# Patient Record
Sex: Female | Born: 1980 | Race: White | Hispanic: No | Marital: Married | Smoking: Current every day smoker
Health system: Southern US, Community
[De-identification: ages and names within clinical notes are randomized; demographics above are authoritative.]

## PROBLEM LIST (undated history)

## (undated) DIAGNOSIS — F3130 Bipolar disorder, current episode depressed, mild or moderate severity, unspecified: Secondary | ICD-10-CM

## (undated) DIAGNOSIS — F419 Anxiety disorder, unspecified: Secondary | ICD-10-CM

## (undated) DIAGNOSIS — R6510 Systemic inflammatory response syndrome (SIRS) of non-infectious origin without acute organ dysfunction: Secondary | ICD-10-CM

## (undated) DIAGNOSIS — L732 Hidradenitis suppurativa: Secondary | ICD-10-CM

## (undated) DIAGNOSIS — E669 Obesity, unspecified: Secondary | ICD-10-CM

## (undated) DIAGNOSIS — F329 Major depressive disorder, single episode, unspecified: Secondary | ICD-10-CM

## (undated) DIAGNOSIS — F319 Bipolar disorder, unspecified: Secondary | ICD-10-CM

## (undated) DIAGNOSIS — I10 Essential (primary) hypertension: Secondary | ICD-10-CM

## (undated) DIAGNOSIS — M545 Low back pain: Secondary | ICD-10-CM

## (undated) DIAGNOSIS — E871 Hypo-osmolality and hyponatremia: Secondary | ICD-10-CM

## (undated) HISTORY — DX: Hidradenitis suppurativa: L73.2

## (undated) HISTORY — DX: Essential (primary) hypertension: I10

## (undated) HISTORY — DX: Hypo-osmolality and hyponatremia: E87.1

## (undated) HISTORY — DX: Systemic inflammatory response syndrome (sirs) of non-infectious origin without acute organ dysfunction: R65.10

## (undated) HISTORY — DX: Obesity, unspecified: E66.9

## (undated) HISTORY — DX: Low back pain: M54.5

## (undated) HISTORY — PX: CHOLECYSTECTOMY: SHX55

## (undated) HISTORY — DX: Major depressive disorder, single episode, unspecified: F32.9

## (undated) HISTORY — DX: Bipolar disorder, current episode depressed, mild or moderate severity, unspecified: F31.30

## (undated) HISTORY — PX: WISDOM TOOTH EXTRACTION: SHX21

## (undated) SURGERY — Surgical Case
Anesthesia: *Unknown

---
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
## 2003-11-05 HISTORY — PX: CYSTECTOMY: SHX5119

## 2009-10-27 ENCOUNTER — Emergency Department (HOSPITAL_COMMUNITY): Admission: EM | Admit: 2009-10-27 | Discharge: 2009-10-27 | Payer: Self-pay | Admitting: Emergency Medicine

## 2009-12-16 ENCOUNTER — Emergency Department (HOSPITAL_COMMUNITY): Admission: EM | Admit: 2009-12-16 | Discharge: 2009-12-16 | Payer: Self-pay | Admitting: Emergency Medicine

## 2010-06-19 ENCOUNTER — Emergency Department (HOSPITAL_COMMUNITY)
Admission: EM | Admit: 2010-06-19 | Discharge: 2010-06-19 | Payer: Self-pay | Source: Home / Self Care | Admitting: Emergency Medicine

## 2010-12-09 ENCOUNTER — Emergency Department (HOSPITAL_COMMUNITY)
Admission: EM | Admit: 2010-12-09 | Discharge: 2010-12-09 | Disposition: A | Discharge status: Home / Self Care | Payer: Federal, State, Local not specified - PPO | Attending: Emergency Medicine | Admitting: Emergency Medicine

## 2010-12-09 DIAGNOSIS — Z79899 Other long term (current) drug therapy: Secondary | ICD-10-CM | POA: Insufficient documentation

## 2010-12-09 DIAGNOSIS — K219 Gastro-esophageal reflux disease without esophagitis: Secondary | ICD-10-CM | POA: Insufficient documentation

## 2010-12-09 DIAGNOSIS — L02219 Cutaneous abscess of trunk, unspecified: Secondary | ICD-10-CM | POA: Insufficient documentation

## 2010-12-09 DIAGNOSIS — R109 Unspecified abdominal pain: Secondary | ICD-10-CM | POA: Insufficient documentation

## 2010-12-09 DIAGNOSIS — L03319 Cellulitis of trunk, unspecified: Secondary | ICD-10-CM | POA: Insufficient documentation

## 2010-12-09 DIAGNOSIS — E669 Obesity, unspecified: Secondary | ICD-10-CM | POA: Insufficient documentation

## 2011-01-09 ENCOUNTER — Ambulatory Visit: Payer: Federal, State, Local not specified - PPO | Admitting: Family Medicine

## 2011-01-18 LAB — CULTURE, ROUTINE-ABSCESS: Gram Stain: NONE SEEN

## 2011-07-07 ENCOUNTER — Emergency Department (HOSPITAL_COMMUNITY)
Admission: EM | Admit: 2011-07-07 | Discharge: 2011-07-07 | Disposition: A | Discharge status: Home / Self Care | Payer: Federal, State, Local not specified - PPO | Attending: Emergency Medicine | Admitting: Emergency Medicine

## 2011-07-07 DIAGNOSIS — M545 Low back pain, unspecified: Secondary | ICD-10-CM | POA: Insufficient documentation

## 2011-07-07 DIAGNOSIS — K219 Gastro-esophageal reflux disease without esophagitis: Secondary | ICD-10-CM | POA: Insufficient documentation

## 2011-07-07 DIAGNOSIS — M79609 Pain in unspecified limb: Secondary | ICD-10-CM | POA: Insufficient documentation

## 2011-07-07 DIAGNOSIS — IMO0001 Reserved for inherently not codable concepts without codable children: Secondary | ICD-10-CM | POA: Insufficient documentation

## 2011-07-17 ENCOUNTER — Ambulatory Visit (INDEPENDENT_AMBULATORY_CARE_PROVIDER_SITE_OTHER): Payer: Federal, State, Local not specified - PPO | Admitting: Family Medicine

## 2011-07-17 ENCOUNTER — Encounter: Payer: Self-pay | Admitting: Family Medicine

## 2011-07-17 VITALS — BP 142/90 | HR 88 | Temp 98.3°F | Resp 12 | Ht 64.75 in | Wt 251.0 lb

## 2011-07-17 DIAGNOSIS — F419 Anxiety disorder, unspecified: Secondary | ICD-10-CM

## 2011-07-17 DIAGNOSIS — I10 Essential (primary) hypertension: Secondary | ICD-10-CM | POA: Insufficient documentation

## 2011-07-17 DIAGNOSIS — K219 Gastro-esophageal reflux disease without esophagitis: Secondary | ICD-10-CM | POA: Insufficient documentation

## 2011-07-17 DIAGNOSIS — L732 Hidradenitis suppurativa: Secondary | ICD-10-CM | POA: Insufficient documentation

## 2011-07-17 DIAGNOSIS — F411 Generalized anxiety disorder: Secondary | ICD-10-CM

## 2011-07-17 DIAGNOSIS — M545 Low back pain: Secondary | ICD-10-CM

## 2011-07-17 MED ORDER — SERTRALINE HCL 50 MG PO TABS: 50.0000 mg | ORAL_TABLET | Freq: Every day | ORAL | Status: DC

## 2011-07-17 NOTE — Progress Notes (Signed)
  Subjective:    Patient ID: Jill Peters, female    DOB: 1980-11-10, 30 y.o.   MRN: [id number]  HPI New to establish care. Past medical history reviewed. History of GERD, hypertension, and hidradenitis suppurativa. Recently started HCTZ 25 mg a day for hypertension. GERD controlled with omeprazole 20 mg daily. Seasonal allergies treated with Zyrtec 10 mg daily. Allergy to penicillin. Previous intolerance in that proximal and.  Recent problem with low back pain. Similar episode about one year ago. No history of depression. Low frustration tolerance at times. This is becoming more of a daily problem. She does very anxious in general. No specific stressors.  Social history is that she is married. Works as a [employer]er. Smokes 1/2-1 packs years per day. Occasional alcohol use.   Review of Systems  Constitutional: Negative for fever, chills, appetite change and unexpected weight change.  Respiratory: Negative for cough and shortness of breath.   Cardiovascular: Negative for chest pain, palpitations and leg swelling.  Gastrointestinal: Negative for abdominal pain.  Musculoskeletal: Positive for back pain. Negative for myalgias and joint swelling.  Neurological: Negative for dizziness, weakness, numbness and headaches.  Hematological: Negative for adenopathy.  Psychiatric/Behavioral: The patient is nervous/anxious.        Objective:   Physical Exam  Constitutional: She is oriented to person, place, and time. She appears well-developed and well-nourished.  Neck: Neck supple. No thyromegaly present.  Cardiovascular: Normal rate, regular rhythm and normal heart sounds.   No murmur heard. Pulmonary/Chest: Effort normal and breath sounds normal. No respiratory distress. She has no wheezes. She has no rales.  Musculoskeletal: She exhibits no edema.       Straight leg raise is negative bilaterally  Lymphadenopathy:    She has no cervical adenopathy.  Neurological: She is alert and  oriented to person, place, and time. No cranial nerve deficit.  Psychiatric: She has a normal mood and affect. Her behavior is normal.          Assessment & Plan:  #1 low back pain. Nonfocal neurologic exam today. Discussed possible physical therapy. Patient will try home exercises first.  #2 chronic anxiety. No concern for depression. No suspicion for bipolar. Discussed possible counseling. Trial sertraline 50 mg daily and reassess one month  #3 hypertension. Marginal control. Her weight loss. Consider additional medication not better control next visit  #4 history of GERD

## 2011-07-17 NOTE — Patient Instructions (Signed)
Low Back Strain with Rehab   A strain is an injury in which a tendon or muscle is torn. The muscles and tendons of the lower back are vulnerable to strains. However, these muscles and tendons are very strong and require a great force to be injured. Strains are classified into three categories. Grade 1 strains cause pain, but the tendon is not lengthened.  Grade 2 strains include a lengthened ligament, due to the ligament being stretched or partially ruptured. With grade 2 strains there is still function, although the function may be decreased. Grade 3 strains involve a complete tear of the tendon or muscle, and function is usually impaired.   SYMPTOMS  Pain in the lower back.  Pain that affects one side more than the other.  Pain that gets worse with movement and may be felt in the hip, buttocks, or back of the thigh.  Muscle spasms of the muscles in the back.  Swelling along the muscles of the back.  Loss of strength of the back muscles.  Crackling sound (crepitation) when the muscles are touched.   CAUSES  Lower back strains occur when a force is placed on the muscles or tendons that is greater than they can handle. Common causes of injury include:  Prolonged overuse of the muscle-tendon units in the lower back, usually from incorrect posture.  A single violent injury or force applied to the back.   RISK INCREASES WITH   Sports that involve twisting forces on the spine or a lot of bending at the waist (football, rugby, weightlifting, bowling, golf, tennis, speed skating, racquetball, swimming, running, gymnastics, diving).  Poor strength and flexibility.  Failure to warm up properly before activity.  Family history of lower back pain or disk disorders.  Previous back injury or surgery (especially fusion).  Poor posture with lifting, especially heavy objects.  Prolonged sitting, especially with poor posture.   PREVENTIVE MEASURES   Learn and use proper posture when  sitting or lifting (maintain proper posture when sitting, lift using the knees and legs, not at the waist).  Warm up and stretch properly before activity.  Allow for adequate recovery between workouts.  Maintain physical fitness: l Strength, flexibility, and endurance. l Cardiovascular fitness.   PROGNOSIS If treated properly, lower back strains usually heal within 6 weeks.   POSSIBLE COMPLICATIONS   Recurring symptoms, resulting in a chronic problem.  Chronic inflammation, scarring, and partial muscle-tendon tear.  Delayed healing or resolution of symptoms.  Prolonged disability.   GENERAL TREATMENT CONSIDERATIONS  Treatment first involves the use of ice and medicine, to reduce pain and inflammation. The use of strengthening and stretching exercises may help reduce pain with activity. These exercises may be performed at home or with a therapist. Severe injuries may require referral to a therapist for further evaluation and treatment, such as ultrasound. Your caregiver may advise that you wear a back brace or corset, to help reduce pain and discomfort. Often, prolonged bed rest results in greater harm then benefit. Corticosteroid injections may be recommended. However, these should be reserved for the most serious cases. It is important to avoid using your back when lifting objects. At night, sleep on your back on a firm mattress with a pillow placed under your knees. If non-surgical treatment is unsuccessful, surgery may be needed.    MEDICATION:   If pain medicine is needed, nonsteroidal anti-inflammatory medicines (aspirin and ibuprofen), or other minor pain relievers (acetaminophen), are often advised.   Do not take pain medicine  for 7 days before surgery.   Prescription pain relievers may be given, if your caregiver thinks they are needed. Use only as directed and only as much as you need.  Ointments applied to the skin may be helpful.  Corticosteroid injections may be given  by your caregiver. These injections should be reserved for the most serious cases, because they may only be given a certain number of times.   HEAT AND COLD:   Cold treatment (icing) should be applied for 10 to 15 minutes every 2 to 3 hours for inflammation and pain, and immediately after activity that aggravates your symptoms. Use ice packs or an ice massage.  Heat treatment may be used before performing stretching and strengthening activities prescribed by your caregiver, physical therapist, or athletic trainer. Use a heat pack or a warm water soak.     SEEK MEDICAL CARE IF:   Symptoms get worse or do not improve in 2 to 4 weeks, despite treatment.  You develop numbness, weakness, or loss of bowel or bladder function.  New, unexplained symptoms develop. (Drugs used in treatment may produce side effects.)     EXERCISES   RANGE OF MOTION AND STRETCHING EXERCISES - Low Back Strain Most people with lower back pain will find that their symptoms get worse with excessive bending forward (flexion) or arching at the lower back (extension). The exercises which will help resolve your symptoms will focus on the opposite motion.    Your physician, physical therapist or athletic trainer will help you determine which exercises will be most helpful to resolve your lower back pain. Do not complete any exercises without first consulting with your caregiver. Discontinue any exercises which make your symptoms worse until you speak to your caregiver.    If you have pain, numbness or tingling which travels down into your buttocks, leg or foot, the goal of the therapy is for these symptoms to move closer to your back and eventually resolve. Sometimes, these leg symptoms will get better, but your lower back pain may worsen. This is typically an indication of progress in your rehabilitation. Be very alert to any changes in your symptoms and the activities in which you participated in the 24 hours prior to the  change. Sharing this information with your caregiver will allow him/her to most efficiently treat your condition.    These exercises may help you when beginning to rehabilitate your injury. Your symptoms may resolve with or without further involvement from your physician, physical therapist or athletic trainer. While completing these exercises, remember:   Restoring tissue flexibility helps normal motion to return to the joints. This allows healthier, less painful movement and activity.  An effective stretch should be held for at least 30 seconds.  A stretch should never be painful. You should only feel a gentle lengthening or release in the stretched tissue.      FLEXION RANGE OF MOTION AND STRETCHING EXERCISES:   STRETCH - Flexion, Single Knee to Chest   Lie on a firm bed or floor with both legs extended in front of you.  Keeping one leg in contact with the floor, bring your opposite knee to your chest. Hold your leg in place by either grabbing behind your thigh or at your knee.  Pull until you feel a gentle stretch in your lower back. Hold __________ seconds.  Slowly release your grasp and repeat the exercise with the opposite side. Repeat __________ times. Complete this exercise __________ times per day.  STRETCH - Flexion, Double Knee to Chest   Lie on a firm bed or floor with both legs extended in front of you.  Keeping one leg in contact with the floor, bring your opposite knee to your chest.    Tense your stomach muscles to support your back and then lift your other knee to your chest. Hold your legs in place by either grabbing behind your thighs or at your knees.  Pull both knees toward your chest until you feel a gentle stretch in your lower back. Hold __________ seconds.  Tense your stomach muscles and slowly return one leg at a time to the floor. Repeat __________ times. Complete this exercise __________ times per day.     STRETCH - Low Trunk Rotation   Lie on  a firm bed or floor. Keeping your legs in front of you, bend your knees so they are both pointed toward the ceiling and your feet are flat on the floor.  Extend your arms out to the side. This will stabilize your upper body by keeping your shoulders in contact with the floor.  Gently and slowly drop both knees together to one side until you feel a gentle stretch in your lower back. Hold for __________ seconds.   Tense your stomach muscles to support your lower back as you bring your knees back to the starting position. Repeat the exercise to the other side. Repeat __________ times. Complete this exercise __________ times per day      EXTENSION RANGE OF MOTION AND FLEXIBILITY EXERCISES:  STRETCH - Extension, Prone on Elbows   Lie on your stomach on the floor, a bed will be too soft. Place your palms about shoulder width apart and at the height of your head.  Place your elbows under your shoulders. If this is too painful, stack pillows under your chest.  Allow your body to relax so that your hips drop lower and make contact more completely with the floor.  Hold this position for __________ seconds.  Slowly return to lying flat on the floor. Repeat __________ times. Complete this exercise __________ times per day.     RANGE OF MOTION - Extension, Prone Press Ups   Lie on your stomach on the floor, a bed will be too soft. Place your palms about shoulder width apart and at the height of your head.  Keeping your back as relaxed as possible, slowly straighten your elbows while keeping your hips on the floor. You may adjust the placement of your hands to maximize your comfort. As you gain motion, your hands will come more underneath your shoulders.  Hold this position __________ seconds.  Slowly return to lying flat on the floor. Repeat __________ times. Complete this exercise __________ times per day.      RANGE OF MOTION- Quadruped, Neutral Spine   Assume a hands and knees position  on a firm surface. Keep your hands under your shoulders and your knees under your hips. You may place padding under your knees for comfort.    Drop your head and point your tail bone toward the ground below you.  This will round out your lower back like an angry cat. Hold this position for __________ seconds.   Slowly lift your head and release your tail bone so that your back sags into a large arch, like an old horse.  Hold this position for __________ seconds.   Repeat this until you feel limber in your lower back.  Now, find your "sweet spot."  This will be the most comfortable position somewhere between the two previous positions. This is your neutral spine. Once you have found this position, tense your stomach muscles to support your lower back.  Hold this position for __________ seconds. Repeat __________ times. Complete this exercise __________ times per day.      STRENGTHENING EXERCISES - Low Back Strain These exercises may help you when beginning to rehabilitate your injury. These exercises should be done near your "sweet spot." This is the neutral, low-back arch, somewhere between fully rounded and fully arched, that is your least painful position. When performed in this safe range of motion, these exercises can be used for people who have either a flexion or extension based injury. These exercises may resolve your symptoms with or without further involvement from your physician, physical therapist or athletic trainer. While completing these exercises, remember:   Muscles can gain both the endurance and the strength needed for everyday activities through controlled exercises.  Complete these exercises as instructed by your physician, physical therapist or athletic trainer. Increase the resistance and repetitions only as guided.  You may experience muscle soreness or fatigue, but the pain or discomfort you are trying to eliminate should never worsen during these exercises. If this pain  does worsen, stop and make certain you are following the directions exactly. If the pain is still present after adjustments, discontinue the exercise until you can discuss the trouble with your caregiver.     STRENGTHENING - Deep Abdominals, Pelvic Tilt   Lie on a firm bed or floor. Keeping your legs in front of you, bend your knees so they are both pointed toward the ceiling and your feet are flat on the floor.  Tense your lower abdominal muscles to press your lower back into the floor. This motion will rotate your pelvis so that your tail bone is scooping upwards rather than pointing at your feet or into the floor.  With a gentle tension and even breathing, hold this position for __________ seconds. Repeat __________ times. Complete this exercise __________ times per day.     STRENGTHENING - Abdominals, Crunches   Lie on a firm bed or floor. Keeping your legs in front of you, bend your knees so they are both pointed toward the ceiling and your feet are flat on the floor. Cross your arms over your chest.    Slightly tip your chin down without bending your neck.  Tense your abdominals and slowly lift your trunk high enough to just clear your shoulder blades. Lifting higher can put excessive stress on the lower back and does not further strengthen your abdominal muscles.  Control your return to the starting position. Repeat __________ times. Complete this exercise __________ times per day.   STRENGTHENING - Quadruped, Opposite UE/LE Lift   Assume a hands and knees position on a firm surface. Keep your hands under your shoulders and your knees under your hips. You may place padding under your knees for comfort.    Find your neutral spine and gently tense your abdominal muscles so that you can maintain this position. Your shoulders and hips should form a rectangle that is parallel with the floor and is not twisted.   Keeping your trunk steady, lift your right hand no higher than your  shoulder and then your left leg no higher than your hip. Make sure you are not holding your breath. Hold this position __________ seconds.  Continuing to keep your abdominal muscles tense and your back steady, slowly  return to your starting position. Repeat with the opposite arm and leg. Repeat __________ times. Complete this exercise __________ times per day.     STRENGTHENING - Lower Abdominals, Double Knee Lift  Lie on a firm bed or floor. Keeping your legs in front of you, bend your knees so they are both pointed toward the ceiling and your feet are flat on the floor.   Tense your abdominal muscles to brace your lower back and slowly lift both of your knees until they come over your hips. Be certain not to hold your breath.    Hold __________ seconds. Using your abdominal muscles, return to the starting position in a slow and controlled manner.   Repeat __________ times. Complete this exercise __________ times per day.      POSTURE AND BODY MECHANICS CONSIDERATIONS - Low Back Strain Keeping correct posture when sitting, standing or completing your activities will reduce the stress put on different body tissues, allowing injured tissues a chance to heal and limiting painful experiences. The following are general guidelines for improved posture. Your physician or physical therapist will provide you with any instructions specific to your needs. While reading these guidelines, remember:  The exercises prescribed by your provider will help you have the flexibility and strength to maintain correct postures.  The correct posture provides the best environment for your joints to work. All of your joints have less wear and tear when properly supported by a spine with good posture. This means you will experience a healthier, less painful body.  Correct posture must be practiced with all of your activities, especially prolonged sitting and standing.  Correct posture is as important when doing  repetitive low-stress activities (typing) as it is when doing a single heavy-load activity (lifting).       RESTING POSITIONS Consider which positions are most painful for you when choosing a resting position.  If you have pain with flexion-based activities (sitting, bending, stooping, squatting), choose a position that allows you to rest in a less flexed posture. You would want to avoid curling into a fetal position on your side. If your pain worsens with extension-based activities (prolonged standing, working overhead), avoid resting in an extended position such as sleeping on your stomach. Most people will find more comfort when they rest with their spine in a more neutral position, neither too rounded nor too arched.  Lying on a non-sagging bed on your side with a pillow between your knees, or on your back with a pillow under your knees will often provide some relief. Keep in mind, being in any one position for a prolonged period of time, no matter how correct your posture, can still lead to stiffness.     PROPER SITTING POSTURE In order to minimize stress and discomfort on your spine, you must sit with correct posture. Sitting with good posture should be effortless for a healthy body. Returning to good posture is a gradual process. Many people can work toward this most comfortably by using various supports until they have the flexibility and strength to maintain this posture on their own.   When sitting with proper posture, your ears will fall over your shoulders and your shoulders will fall over your hips. You should use the back of the chair to support your upper back. Your lower back will be in a neutral position, just slightly arched. You may place a small pillow or folded towel at the base of your lower back for  support.    When  working at [employer], create an environment that supports good, upright posture. Without extra support, muscles tire, which leads to excessive strain on joints and other  tissues.  Keep these recommendations in mind:   CHAIR:    A chair should be able to slide under your desk when your back makes contact with the back of the chair. This allows you to work closely.  The chair's height should allow your eyes to be level with the upper part of your monitor and your hands to be slightly lower than your elbows.  BODY POSITION  Your feet should make contact with the floor. If this is not possible, use a foot rest.  Keep your ears over your shoulders. This will reduce stress on your neck and lower back.     INCORRECT SITTING POSTURES  If you are feeling tired and unable to assume a healthy sitting posture, do not slouch or slump. This puts excessive strain on your back tissues, causing more damage and pain. Healthier options include:  Using more support, like a lumbar pillow.  Switching tasks to something that requires you to be upright or walking.  Talking a brief walk.  Lying down to rest in a neutral-spine position.      PROLONGED STANDING WHILE SLIGHTLY LEANING FORWARD  When completing a task that requires you to lean forward while standing in one place for a long time, place either foot up on a stationary 2-4 inch high object to help maintain the best posture. When both feet are on the ground, the lower back tends to lose its slight inward curve. If this curve flattens (or becomes too large), then the back and your other joints will experience too much stress, tire more quickly, and can cause pain.     CORRECT STANDING POSTURES Proper standing posture should be assumed with all daily activities, even if they only take a few moments, like when brushing your teeth. As in sitting, your ears should fall over your shoulders and your shoulders should fall over your hips. You should keep a slight tension in your abdominal muscles to brace your spine. Your tailbone should point down to the ground, not behind your body, resulting in an over-extended swayback  posture.      INCORRECT STANDING POSTURES  Common incorrect standing postures include a forward head, locked knees and/or an excessive swayback.     WALKING Walk with an upright posture. Your ears, shoulders and hips should all line-up.     PROLONGED ACTIVITY IN A FLEXED POSITION When completing a task that requires you to bend forward at your waist or lean over a low surface, try to find a way to stabilize 3 out of 4 of your limbs. You can place a hand or elbow on your thigh or rest a knee on the surface you are reaching across. This will provide you more stability so that your muscles do not fatigue as quickly. By keeping your knees relaxed, or slightly bent, you will also reduce stress across your lower back.     CORRECT LIFTING TECHNIQUES DO :     Assume a wide stance. This will provide you more stability and the opportunity to get as close as possible to the object which you are lifting.  Tense your abdominals to brace your spine. Bend at the knees and hips. Keeping your back locked in a neutral-spine position, lift using your leg muscles. Lift with your legs, keeping your back straight.  Test the weight of unknown  objects before attempting to lift them.  Try to keep your elbows locked down at your sides in order get the best strength from your shoulders when carrying an object.  Always ask for help when lifting heavy or awkward objects.    INCORRECT LIFTING TECHNIQUES DO NOT:   Lock your knees when lifting, even if it is a small object.  Bend and twist.  Pivot at your feet or move your feet when needing to change directions.  Assume that you can safely pick up even a paperclip without proper posture.   Document Released: 10/21/2005  Document Re-Released: 08/18/2009 [facility] Patient Information 2011 [place], [place].Back Exercises   These exercises may help you when beginning to rehabilitate your injury. Your symptoms may resolve with or without further involvement from  your physician, physical therapist or athletic trainer. While completing these exercises, remember:   Restoring tissue flexibility helps normal motion to return to the joints. This allows healthier, less painful movement and activity.  An effective stretch should be held for at least 30 seconds.  A stretch should never be painful. You should only feel a gentle lengthening or release in the stretched tissue.   STRETCH - Extension, Prone on Elbows   Lie on your stomach on the floor, a bed will be too soft. Place your palms about shoulder width apart and at the height of your head.  Place your elbows under your shoulders. If this is too painful, stack pillows under your chest.  Allow your body to relax so that your hips drop lower and make contact more completely with the floor.  Hold this position for __________ seconds.  Slowly return to lying flat on the floor. Repeat __________ times. Complete this exercise __________ times per day.      RANGE OF MOTION - Extension, Prone Press Ups   Lie on your stomach on the floor, a bed will be too soft. Place your palms about shoulder width apart and at the height of your head.  Keeping your back as relaxed as possible, slowly straighten your elbows while keeping your hips on the floor. You may adjust the placement of your hands to maximize your comfort. As you gain motion, your hands will come more underneath your shoulders.  Hold this position __________ seconds.  Slowly return to lying flat on the floor. Repeat __________ times. Complete this exercise __________ times per day.        RANGE OF MOTION- Quadruped, Neutral Spine   Assume a hands and knees position on a firm surface. Keep your hands under your shoulders and your knees under your hips. You may place padding under your knees for comfort.    Drop your head and point your tail bone toward the ground below you.  This will round out your low back like an angry cat. Hold this  position for __________ seconds.   Slowly lift your head and release your tail bone so that your back sags into a large arch, like an old horse.  Hold this position for __________ seconds.   Repeat this until you feel limber in your low back.  Now, find your "sweet spot." This will be the most comfortable position somewhere between the two previous positions. This is your neutral spine. Once you have found this position, tense your stomach muscles to support your low back.  Hold this position for __________ seconds. Repeat __________ times.  Complete this exercise __________ times per day.      STRETCH - Flexion, Single  Knee to Chest   Lie on a firm bed or floor with both legs extended in front of you.  Keeping one leg in contact with the floor, bring your opposite knee to your chest. Hold your leg in place by either grabbing behind your thigh or at your knee.  Pull until you feel a gentle stretch in your low back. Hold __________ seconds.  Slowly release your grasp and repeat the exercise with the opposite side. Repeat __________ times. Complete this exercise __________ times per day.     STRETCH - Hamstrings, Standing  Stand or sit and extend your __________ leg, placing your foot on a chair or foot stool  Keeping a slight arch in your low back and your hips straight forward.    Lead with your chest and lean forward at the waist until you feel a gentle stretch in the back of your __________ knee or thigh. (When done correctly, this exercise requires leaning only a small distance.)  Hold this position for __________ seconds. Repeat __________ times. Complete this stretch __________ times per day.     STRENGTHENING - Deep Abdominals, Pelvic Tilt   Lie on a firm bed or floor. Keeping your legs in front of you, bend your knees so they are both pointed toward the ceiling and your feet are flat on the floor.  Tense your lower abdominal muscles to press your low back into the  floor. This motion will rotate your pelvis so that your tail bone is scooping upwards rather than pointing at your feet or into the floor.  With a gentle tension and even breathing, hold this position for __________ seconds. Repeat __________ times. Complete this exercise __________ times per day.     STRENGTHENING - Abdominals, Crunches   Lie on a firm bed or floor. Keeping your legs in front of you, bend your knees so they are both pointed toward the ceiling and your feet are flat on the floor. Cross your arms over your chest.    Slightly tip your chin down without bending your neck.  Tense your abdominals and slowly lift your trunk high enough to just clear your shoulder blades. Lifting higher can put excessive stress on the low back and does not further strengthen your abdominal muscles.  Control your return to the starting position. Repeat __________ times. Complete this exercise __________ times per day.      STRENGTHENING - Quadruped, Opposite UE/LE Lift   Assume a hands and knees position on a firm surface. Keep your hands under your shoulders and your knees under your hips.  You may place padding under your knees for comfort.    Find your neutral spine and gently tense your abdominal muscles so that you can maintain this position. Your shoulders and hips should form a rectangle that is parallel with the floor and is not twisted.   Keeping your trunk steady, lift your right hand no higher than your shoulder and then your left leg no higher than your hip. Make sure you are not holding your breath. Hold this position __________ seconds.  Continuing to keep your abdominal muscles tense and your back steady, slowly return to your starting position. Repeat with the opposite arm and leg. Repeat __________ times. Complete this exercise __________ times per day.   Document Released: 11/08/2005  Document Re-Released: 10/09/2009 [facility] Patient Information 2011 [place], [place].

## 2011-08-01 ENCOUNTER — Ambulatory Visit: Payer: Federal, State, Local not specified - PPO | Admitting: Internal Medicine

## 2011-08-12 ENCOUNTER — Ambulatory Visit (HOSPITAL_BASED_OUTPATIENT_CLINIC_OR_DEPARTMENT_OTHER)
Admission: RE | Admit: 2011-08-12 | Discharge: 2011-08-12 | Disposition: A | Discharge status: Home / Self Care | Payer: Federal, State, Local not specified - PPO | Source: Ambulatory Visit | Attending: Internal Medicine | Admitting: Internal Medicine

## 2011-08-12 ENCOUNTER — Encounter: Payer: Self-pay | Admitting: Internal Medicine

## 2011-08-12 ENCOUNTER — Ambulatory Visit (INDEPENDENT_AMBULATORY_CARE_PROVIDER_SITE_OTHER): Payer: Federal, State, Local not specified - PPO | Admitting: Internal Medicine

## 2011-08-12 VITALS — BP 136/80 | HR 93 | Temp 98.0°F | Resp 20 | Ht 64.0 in | Wt 241.0 lb

## 2011-08-12 DIAGNOSIS — L0390 Cellulitis, unspecified: Secondary | ICD-10-CM

## 2011-08-12 DIAGNOSIS — L0291 Cutaneous abscess, unspecified: Secondary | ICD-10-CM

## 2011-08-12 DIAGNOSIS — M545 Low back pain, unspecified: Secondary | ICD-10-CM

## 2011-08-12 DIAGNOSIS — L732 Hidradenitis suppurativa: Secondary | ICD-10-CM

## 2011-08-12 DIAGNOSIS — M549 Dorsalgia, unspecified: Secondary | ICD-10-CM

## 2011-08-12 DIAGNOSIS — I10 Essential (primary) hypertension: Secondary | ICD-10-CM

## 2011-08-12 HISTORY — DX: Low back pain, unspecified: M54.50

## 2011-08-12 MED ORDER — KETOROLAC TROMETHAMINE 30 MG/ML IJ SOLN
30.0000 mg | Freq: Once | INTRAMUSCULAR | Status: AC
  Administered 2011-08-12: 30 mg via INTRAMUSCULAR

## 2011-08-12 MED ORDER — CYCLOBENZAPRINE HCL 10 MG PO TABS: 10.0000 mg | ORAL_TABLET | Freq: Three times a day (TID) | ORAL | Status: DC | PRN

## 2011-08-12 MED ORDER — DOXYCYCLINE HYCLATE 100 MG PO TABS: 100.0000 mg | ORAL_TABLET | Freq: Two times a day (BID) | ORAL | Status: AC

## 2011-08-12 MED ORDER — METHYLPREDNISOLONE (PAK) 4 MG PO TABS: 4.0000 mg | ORAL_TABLET | Freq: Every day | ORAL | Status: DC

## 2011-08-12 NOTE — Progress Notes (Signed)
  Subjective:    Patient ID: Jill Peters, female    DOB: 12-25-1980, 30 y.o.   MRN: [id number]  HPI Pt presents to clinic to establish care and for evaluation of back pain. Has chronic intermittent lbp with recent exacerbation after moving furniture 4 days ago. Notes pain radiating down left leg with associated paresthesias. No weakness. Has been treated in the past for sciatica. Has not undergone imaging with plain radiograph or mri. Taking ibuprofen without significant improvement. Also notes h/o hidradenitis suppurativa involving left axilla and left inframmamary skin fold. Has required I&D in the past but not recently. Now with 4 day h/o left inframammary and left axilla st mass with erythema and warmth. Has been some spontaneous drainage from the axilla. No f/c. Was started on hctz ~ 71month ago and states was told chem7, lft and lipid nl at that time. No other complaints.  Reviewed pmh, psh, medications, allergies, soc hx, fam hx.    Review of Systems  Constitutional: Negative for fever and chills.  Musculoskeletal: Positive for myalgias and back pain. Negative for gait problem.  Skin: Positive for color change and wound. Negative for rash.  Neurological: Positive for numbness. Negative for weakness.  All other systems reviewed and are negative.       Objective:   Physical Exam  Nursing note and vitals reviewed. Constitutional: She appears well-developed and well-nourished. No distress.  HENT:  Head: Normocephalic and atraumatic.  Right Ear: External ear normal.  Left Ear: External ear normal.  Eyes: Conjunctivae are normal. No scleral icterus.  Cardiovascular: Normal rate, regular rhythm and normal heart sounds.  Exam reveals no gallop and no friction rub.   No murmur heard. Pulmonary/Chest: Effort normal and breath sounds normal. No respiratory distress. She has no wheezes. She has no rales.  Musculoskeletal:       Left paraspinal muscle spasm. Gait nl. Bilateral LE  strength 5/5.  Neurological: She is alert.  Skin: Skin is warm and dry. She is not diaphoretic.       Left axilla with st mass with erythema and warm ~2cm. Expressible discharge.  Psychiatric: She has a normal mood and affect.          Assessment & Plan:

## 2011-08-12 NOTE — Assessment & Plan Note (Addendum)
Associated left radicular leg pain without focal neurologic deficit. Given toradol im injxn today. Attempt medrol dosepak and flexeril prn (cautioned regarding possible sedating effect.) obtain ls plain radiograph. States schedule limits ability to do PT. Will do home exercises (has handouts). Avoid narcotics. Followup if no improvement or worsening.

## 2011-08-12 NOTE — Assessment & Plan Note (Signed)
Normotensive and stable. Continue current regimen. Monitor bp as outpt and followup in clinic as scheduled. Consider chem7 with next visit

## 2011-08-12 NOTE — Assessment & Plan Note (Signed)
Small focal axilla abscess with spontaneous drainage. Wound cx obtained. Begin doxycycline. Followup if no improvement or worsening.

## 2011-08-14 ENCOUNTER — Ambulatory Visit: Payer: Federal, State, Local not specified - PPO | Admitting: Family Medicine

## 2011-08-17 LAB — WOUND CULTURE: Gram Stain: NONE SEEN

## 2011-10-16 ENCOUNTER — Ambulatory Visit: Payer: Federal, State, Local not specified - PPO | Admitting: Internal Medicine

## 2012-01-09 ENCOUNTER — Ambulatory Visit: Payer: Federal, State, Local not specified - PPO | Admitting: Internal Medicine

## 2012-01-23 ENCOUNTER — Encounter (HOSPITAL_COMMUNITY): Payer: Self-pay

## 2012-01-23 ENCOUNTER — Emergency Department (HOSPITAL_COMMUNITY)
Admission: EM | Admit: 2012-01-23 | Discharge: 2012-01-23 | Disposition: A | Discharge status: Home / Self Care | Payer: Federal, State, Local not specified - PPO | Attending: Emergency Medicine | Admitting: Emergency Medicine

## 2012-01-23 DIAGNOSIS — I10 Essential (primary) hypertension: Secondary | ICD-10-CM | POA: Insufficient documentation

## 2012-01-23 DIAGNOSIS — K089 Disorder of teeth and supporting structures, unspecified: Secondary | ICD-10-CM | POA: Insufficient documentation

## 2012-01-23 DIAGNOSIS — F17200 Nicotine dependence, unspecified, uncomplicated: Secondary | ICD-10-CM | POA: Insufficient documentation

## 2012-01-23 DIAGNOSIS — K0889 Other specified disorders of teeth and supporting structures: Secondary | ICD-10-CM

## 2012-01-23 DIAGNOSIS — Z79899 Other long term (current) drug therapy: Secondary | ICD-10-CM | POA: Insufficient documentation

## 2012-01-23 MED ORDER — CLINDAMYCIN HCL 150 MG PO CAPS: 300.0000 mg | ORAL_CAPSULE | Freq: Four times a day (QID) | ORAL | Status: AC

## 2012-01-23 MED ORDER — OXYCODONE-ACETAMINOPHEN 5-325 MG PO TABS
1.0000 | ORAL_TABLET | Freq: Once | ORAL | Status: AC
  Administered 2012-01-23: 1 via ORAL
  Filled 2012-01-23: qty 1

## 2012-01-23 MED ORDER — HYDROCODONE-ACETAMINOPHEN 5-325 MG PO TABS: 1.0000 | ORAL_TABLET | ORAL | Status: AC | PRN

## 2012-01-23 NOTE — ED Notes (Signed)
Pt complains of an abcessed tooth since last night

## 2012-01-23 NOTE — Discharge Instructions (Signed)
Dental Pain  A tooth ache may be caused by cavities (tooth decay). Cavities expose the nerve of the tooth to air and hot or cold temperatures. It may come from an infection or abscess (also called a boil or furuncle) around your tooth. It is also often caused by dental caries (tooth decay). This causes the pain you are having.  DIAGNOSIS   Your caregiver can diagnose this problem by exam.  TREATMENT   · If caused by an infection, it may be treated with medications which kill germs (antibiotics) and pain medications as prescribed by your caregiver. Take medications as directed.  · Only take over-the-counter or prescription medicines for pain, discomfort, or fever as directed by your caregiver.  · Whether the tooth ache today is caused by infection or dental disease, you should see your dentist as soon as possible for further care.  SEEK MEDICAL CARE IF:  The exam and treatment you received today has been provided on an emergency basis only. This is not a substitute for complete medical or dental care. If your problem worsens or new problems (symptoms) appear, and you are unable to meet with your dentist, call or return to this location.  SEEK IMMEDIATE MEDICAL CARE IF:   · You have a fever.  · You develop redness and swelling of your face, jaw, or neck.  · You are unable to open your mouth.  · You have severe pain uncontrolled by pain medicine.  MAKE SURE YOU:   · Understand these instructions.  · Will watch your condition.  · Will get help right away if you are not doing well or get worse.  Document Released: 10/21/2005 Document Revised: 10/10/2011 Document Reviewed: 06/08/2008  Exit[employer]® Patient Information ©2012 [employer].

## 2012-01-23 NOTE — ED Provider Notes (Signed)
History     CSN: [id number]  Arrival date & time 01/23/12  7846   First MD Initiated Contact with Patient 01/23/12 0740      Chief Complaint  Patient presents with  . Dental Pain    Patient is a 31 y.o. female presenting with tooth pain. The history is provided by the patient.  Dental Pain The patients presents with 1 day of dental pain. The pain is moderate.  It is worsened by nothing. It is improved by nothing including NSAIDs. The patient denies recent dental trauma. They deny fever or chills. They have not sought prior care for this. It is described as sore. The dental pain is located in left lower first molar. There is not associated facial swelling.    Past Medical History  Diagnosis Date  . Hypertension   . Hidradenitis suppurativa     Past Surgical History  Procedure Date  . Cystectomy 2005     removed from tail bone    Family History  Problem Relation Age of Onset  . Arthritis Mother   . Leukemia Mother   . Hypertension Father   . Arthritis Maternal Grandmother   . Hypertension Maternal Grandmother     History  Substance Use Topics  . Smoking status: Current Everyday Smoker -- 0.8 packs/day for 7 years    Types: Cigarettes  . Smokeless tobacco: Never Used  . Alcohol Use: Yes    OB History    Grav Para Term Preterm Abortions TAB SAB Ect Mult Living                  Review of Systems  All other systems reviewed and are negative.    Allergies  Penicillins and Naproxen  Home Medications   Current Outpatient Rx  Name Route Sig Dispense Refill  . IBUPROFEN 200 MG PO TABS Oral Take 400 mg by mouth every 6 (six) hours as needed. Pain    . OMEPRAZOLE 20 MG PO CPDR Oral Take 20 mg by mouth daily as needed.     Marland Kitchen CLINDAMYCIN HCL 150 MG PO CAPS Oral Take 2 capsules (300 mg total) by mouth 4 (four) times daily. 56 capsule 0  . HYDROCODONE-ACETAMINOPHEN 5-325 MG PO TABS Oral Take 1 tablet by mouth every 4 (four) hours as needed for pain. 15 tablet 0     BP 162/98  Pulse 93  Temp(Src) 98.9 F (37.2 C) (Oral)  Resp 18  SpO2 98%  Physical Exam  Constitutional: She is oriented to person, place, and time. She appears well-developed and well-nourished.  HENT:  Head: Normocephalic.       Mild tenderness of her left lower first molar.  There is no evidence of gingival swelling or fluctuance.  She's tolerating secretions.  Her oral airway is intact  Eyes: EOM are normal.  Neck: Normal range of motion. Neck supple.  Pulmonary/Chest: Effort normal.  Musculoskeletal: Normal range of motion.  Neurological: She is alert and oriented to person, place, and time.  Psychiatric: She has a normal mood and affect.    ED Course  Procedures (including critical care time)  Labs Reviewed - No data to display No results found.   1. Pain, dental       MDM  Dental Pain. Home with antibiotics and pain medicine. Recommend dental follow up. No signs of gingival abscess. Tolerating secretions. Airway patent. No sub lingular swelling         Lyanne Co, MD 01/23/12 [phone]

## 2012-03-11 ENCOUNTER — Ambulatory Visit (INDEPENDENT_AMBULATORY_CARE_PROVIDER_SITE_OTHER): Payer: Federal, State, Local not specified - PPO | Admitting: Family Medicine

## 2012-03-11 ENCOUNTER — Encounter: Payer: Self-pay | Admitting: Family Medicine

## 2012-03-11 VITALS — BP 142/82 | Temp 99.5°F | Wt 238.0 lb

## 2012-03-11 DIAGNOSIS — J209 Acute bronchitis, unspecified: Secondary | ICD-10-CM

## 2012-03-11 MED ORDER — HYDROCODONE-HOMATROPINE 5-1.5 MG/5ML PO SYRP: 5.0000 mL | ORAL_SOLUTION | Freq: Four times a day (QID) | ORAL | Status: AC | PRN

## 2012-03-11 MED ORDER — AZITHROMYCIN 250 MG PO TABS: ORAL_TABLET | ORAL | Status: AC

## 2012-03-11 NOTE — Patient Instructions (Signed)

## 2012-03-11 NOTE — Progress Notes (Signed)
  Subjective:    Patient ID: Jill Peters, female    DOB: 1981/07/31, 31 y.o.   MRN: [id number]  HPI  Acute visit. Onset about 4-5 days ago of cough. She's had some postnasal drainage. Intermittent sore throat. Cough was initially dry now productive. She had fever up to 102 yesterday and onset of fever this past Sunday. She's tried Mucinex DM with minimal relief of cough. Intermittent headaches. No nausea or vomiting. Denies any hemoptysis. Current smoker about one pack per day.   Review of Systems  Constitutional: Positive for fever, chills and fatigue.  HENT: Positive for congestion, sore throat and postnasal drip.   Respiratory: Positive for cough. Negative for wheezing.   Cardiovascular: Negative for chest pain.  Neurological: Positive for headaches.       Objective:   Physical Exam  Constitutional: She appears well-developed and well-nourished.  HENT:  Right Ear: External ear normal.  Left Ear: External ear normal.  Mouth/Throat: Oropharynx is clear and moist.  Neck: Neck supple.  Cardiovascular: Normal rate and regular rhythm.   Pulmonary/Chest: Effort normal and breath sounds normal. No respiratory distress. She has no wheezes. She has no rales.  Musculoskeletal: She exhibits no edema.  Lymphadenopathy:    She has no cervical adenopathy.          Assessment & Plan:  Acute bronchitis. Question viral. No rales or other suggestion of pneumonia but given prolonged fever and smoking status and productive cough start Zithromax. Hycodan for nighttime relief of cough

## 2012-05-22 ENCOUNTER — Ambulatory Visit (INDEPENDENT_AMBULATORY_CARE_PROVIDER_SITE_OTHER): Payer: Federal, State, Local not specified - PPO | Admitting: Family Medicine

## 2012-05-22 ENCOUNTER — Encounter: Payer: Self-pay | Admitting: Family Medicine

## 2012-05-22 VITALS — BP 140/68 | Temp 99.3°F | Wt 238.0 lb

## 2012-05-22 DIAGNOSIS — K047 Periapical abscess without sinus: Secondary | ICD-10-CM

## 2012-05-22 MED ORDER — CLINDAMYCIN HCL 300 MG PO CAPS: 300.0000 mg | ORAL_CAPSULE | Freq: Three times a day (TID) | ORAL | Status: AC

## 2012-05-22 MED ORDER — HYDROCODONE-ACETAMINOPHEN 5-325 MG PO TABS: ORAL_TABLET | ORAL | Status: DC

## 2012-05-22 NOTE — Progress Notes (Signed)
  Subjective:    Patient ID: Jill Peters, female    DOB: 1981-07-09, 31 y.o.   MRN: [id number]  HPI  Acute visit. Patient presents with possible dental abscess. Started yesterday. She had some puslike drainage and swelling and redness left lower molar. No chills. She's had possible low-grade fever. She called dentist office and not able to get in until next Wednesday. She is taking ibuprofen 800 mg every 8 hours with some relief but still having significant night pain. She has allergy to penicillin. She has tolerated clindamycin in the past.   Review of Systems  Constitutional: Positive for fever. Negative for chills.  Hematological: Positive for adenopathy.       Objective:   Physical Exam  Constitutional: She appears well-developed and well-nourished.  HENT:  Right Ear: External ear normal.  Left Ear: External ear normal.       Patient has some mild erythema left lower gum second to last molar. No visible purulence at this time. Otherwise oropharynx is clear  Neck: Neck supple.  Cardiovascular: Normal rate and regular rhythm.   Pulmonary/Chest: Effort normal and breath sounds normal. No respiratory distress. She has no wheezes. She has no rales.  Lymphadenopathy:    She has no cervical adenopathy.          Assessment & Plan:  Probable early dental abscess. Clindamycin 300 mg 3 times a day for 7 days (PCN allergic). Limited hydrocodone 5/325 mg one to 2 every 6 hours cough or pain. #30 with no refill she is to followup with dentist next week

## 2012-05-22 NOTE — Patient Instructions (Addendum)
Dental Abscess A dental abscess usually starts from an infected tooth. Antibiotic medicine and pain pills can be helpful, but dental infections require the attention of a dentist. Rinse around the infected area often with salt water (a pinch of salt in 8 oz of warm water). Do not apply heat to the outside of your face. See your dentist or oral surgeon as soon as possible.  SEEK IMMEDIATE MEDICAL CARE IF:  You have increasing, severe pain that is not relieved by medicine.   You or your child has an oral temperature above 102 F (38.9 C), not controlled by medicine.   Your baby is older than 3 months with a rectal temperature of 102 F (38.9 C) or higher.   Your baby is 3 months old or younger with a rectal temperature of 100.4 F (38 C) or higher.   You develop chills, severe headache, difficulty breathing, or trouble swallowing.   You have swelling in the neck or around the eye.  Document Released: 10/21/2005 Document Revised: 10/10/2011 Document Reviewed: 04/01/2007 [employer] Patient Information 2012 [employer]. 

## 2012-06-12 ENCOUNTER — Ambulatory Visit (INDEPENDENT_AMBULATORY_CARE_PROVIDER_SITE_OTHER): Payer: Federal, State, Local not specified - PPO | Admitting: Family Medicine

## 2012-06-12 ENCOUNTER — Ambulatory Visit: Payer: Federal, State, Local not specified - PPO | Admitting: Family Medicine

## 2012-06-12 ENCOUNTER — Encounter: Payer: Self-pay | Admitting: Family Medicine

## 2012-06-12 VITALS — BP 140/90 | Temp 99.1°F | Wt 231.0 lb

## 2012-06-12 DIAGNOSIS — R1110 Vomiting, unspecified: Secondary | ICD-10-CM

## 2012-06-12 LAB — CBC WITH DIFFERENTIAL/PLATELET
Basophils Absolute: 0 10*3/uL (ref 0.0–0.1)
Eosinophils Relative: 0.6 % (ref 0.0–5.0)
Monocytes Relative: 3.4 % (ref 3.0–12.0)
Neutrophils Relative %: 69 % (ref 43.0–77.0)
Platelets: 250 10*3/uL (ref 150.0–400.0)
RDW: 15.5 % — ABNORMAL HIGH (ref 11.5–14.6)
WBC: 10.6 10*3/uL — ABNORMAL HIGH (ref 4.5–10.5)

## 2012-06-12 LAB — HEPATIC FUNCTION PANEL
AST: 24 U/L (ref 0–37)
Albumin: 3.8 g/dL (ref 3.5–5.2)
Alkaline Phosphatase: 54 U/L (ref 39–117)
Bilirubin, Direct: 0.2 mg/dL (ref 0.0–0.3)
Total Protein: 7.3 g/dL (ref 6.0–8.3)

## 2012-06-12 LAB — BASIC METABOLIC PANEL
CO2: 24 mEq/L (ref 19–32)
Calcium: 8.7 mg/dL (ref 8.4–10.5)
GFR: 122.61 mL/min (ref >60.00)
Glucose, Bld: 70 mg/dL (ref 70–99)
Potassium: 3.6 mEq/L (ref 3.5–5.1)
Sodium: 138 mEq/L (ref 135–145)

## 2012-06-12 MED ORDER — ONDANSETRON 8 MG PO TBDP: 8.0000 mg | ORAL_TABLET | Freq: Three times a day (TID) | ORAL | Status: AC | PRN

## 2012-06-12 NOTE — Progress Notes (Signed)
  Subjective:    Patient ID: Jill Peters, female    DOB: 1981/03/14, 31 y.o.   MRN: [id number]  HPI  Patient seen as a work in. Onset Monday of recurrent vomiting. No diarrhea. Some diffuse abdominal cramping. Also minimal headache. No major postnasal drip symptoms. Recent urine pregnancy negative. Patient is taking Clomid per GYN.  Denies any dysuria. No other regular medications. No sick contacts.   Review of Systems  Constitutional: Negative for fever and chills.  Cardiovascular: Negative for chest pain.  Gastrointestinal: Positive for nausea and vomiting. Negative for abdominal pain.  Genitourinary: Negative for dysuria.       Objective:   Physical Exam  Constitutional: She appears well-developed and well-nourished.  HENT:  Mouth/Throat: Oropharynx is clear and moist.  Neck: Neck supple. No thyromegaly present.  Cardiovascular: Normal rate and regular rhythm.   Pulmonary/Chest: Effort normal and breath sounds normal. No respiratory distress. She has no wheezes. She has no rales.  Abdominal: Soft. Bowel sounds are normal. She exhibits no distension and no mass. There is no tenderness. There is no rebound and no guarding.  Lymphadenopathy:    She has no cervical adenopathy.          Assessment & Plan:  Recurrent vomiting. Question viral but surprising that she has no diarrhea. Zofran 8 mg every 8 hours prn nausea and vomiting. Check labs with electrolytes, hepatic, and CBC.

## 2012-06-16 NOTE — Progress Notes (Signed)
Quick Note:  Pt informed, reports feeling better ______

## 2012-07-07 ENCOUNTER — Ambulatory Visit (INDEPENDENT_AMBULATORY_CARE_PROVIDER_SITE_OTHER): Payer: Federal, State, Local not specified - PPO | Admitting: Family Medicine

## 2012-07-07 ENCOUNTER — Encounter: Payer: Self-pay | Admitting: Family Medicine

## 2012-07-07 VITALS — BP 120/80 | Temp 98.5°F | Wt 238.0 lb

## 2012-07-07 DIAGNOSIS — J209 Acute bronchitis, unspecified: Secondary | ICD-10-CM

## 2012-07-07 MED ORDER — HYDROCODONE-HOMATROPINE 5-1.5 MG/5ML PO SYRP: 5.0000 mL | ORAL_SOLUTION | Freq: Four times a day (QID) | ORAL | Status: AC | PRN

## 2012-07-07 NOTE — Patient Instructions (Addendum)
Follow up promptly for any fever or worsening symptoms 

## 2012-07-07 NOTE — Progress Notes (Signed)
  Subjective:    Patient ID: Jill Peters, female    DOB: [birth year], 31 y.o.   MRN: [id number]  HPI  Patient is 31 year old smoker seen with 3 history of cough. Mostly dry. Husband had similar symptoms about 2-3 days prior to her illness. Question low-grade fever over weekend. Very poor sleep secondary to coughing. Possible mild wheezing. Some postnasal drip and nasal congestion. Few body aches. No nausea or vomiting. Denies sore throat. Tried Mucinex and NyQuil without relief. Severe cough at night.   Review of Systems  Constitutional: Positive for fatigue. Negative for chills and appetite change.  HENT: Positive for congestion. Negative for sore throat.   Respiratory: Positive for cough. Negative for shortness of breath.   Cardiovascular: Negative for chest pain.       Objective:   Physical Exam  Constitutional: She appears well-developed and well-nourished.  HENT:  Right Ear: External ear normal.  Left Ear: External ear normal.  Mouth/Throat: Oropharynx is clear and moist.  Neck: Neck supple. No thyromegaly present.  Cardiovascular: Normal rate and regular rhythm.   Pulmonary/Chest:       Patient has only a couple of very faint wheezes. No rales. No retractions. Good air movement throughout. Normal respiratory rate.  Lymphadenopathy:    She has no cervical adenopathy.          Assessment & Plan:  Acute bronchitis. Suspect viral. Hycodan cough syrup for cough suppression. Followup promptly for any fever or worsening symptoms. Patient encouraged to stop smoking. She plans to quit soon

## 2012-08-28 ENCOUNTER — Ambulatory Visit (INDEPENDENT_AMBULATORY_CARE_PROVIDER_SITE_OTHER): Payer: Federal, State, Local not specified - PPO | Admitting: Family Medicine

## 2012-08-28 ENCOUNTER — Encounter: Payer: Self-pay | Admitting: Family Medicine

## 2012-08-28 VITALS — BP 110/80 | HR 90 | Temp 99.0°F | Wt 247.0 lb

## 2012-08-28 DIAGNOSIS — K0500 Acute gingivitis, plaque induced: Secondary | ICD-10-CM

## 2012-08-28 DIAGNOSIS — K047 Periapical abscess without sinus: Secondary | ICD-10-CM

## 2012-08-28 MED ORDER — CLINDAMYCIN HCL 300 MG PO CAPS: 300.0000 mg | ORAL_CAPSULE | Freq: Three times a day (TID) | ORAL | Status: DC

## 2012-08-28 MED ORDER — CHLORHEXIDINE GLUCONATE 0.12 % MT SOLN: 15.0000 mL | Freq: Two times a day (BID) | OROMUCOSAL | Status: DC

## 2012-08-28 NOTE — Patient Instructions (Signed)
-  use medications as instructed  -notify your dentist and schedule appointment in next several days  -notify a doctor if fevers  -take ibuprofen 600-800mg  twice daily or tylenol 1000mg  twice daily for pain

## 2012-08-28 NOTE — Progress Notes (Signed)
Chief Complaint  Patient presents with  . tooth abscess    HPI:  Tooth Pain: -started: yesterday evening -symptoms: sever pain, pain L lower back tooth, noticed swelling of gums -denies: fevers, chills, malaise  ROS: See pertinent positives and negatives per HPI.  Past Medical History  Diagnosis Date  . Hypertension   . Hidradenitis suppurativa     Family History  Problem Relation Age of Onset  . Arthritis Mother   . Leukemia Mother   . Hypertension Father   . Arthritis Maternal Grandmother   . Hypertension Maternal Grandmother     History   Social History  . Marital Status: Married    Spouse Name: N/A    Number of Children: N/A  . Years of Education: N/A   Social History Main Topics  . Smoking status: Current Every Day Smoker -- 0.8 packs/day for 7 years    Types: Cigarettes  . Smokeless tobacco: Never Used  . Alcohol Use: Yes  . Drug Use: No  . Sexually Active: None   Other Topics Concern  . None   Social History Narrative  . None    Current outpatient prescriptions:clomiPHENE (CLOMID) 50 MG tablet, Take 50 mg by mouth daily. As directed per GYN., Disp: , Rfl: ;  ibuprofen (ADVIL,MOTRIN) 200 MG tablet, Take 400 mg by mouth every 6 (six) hours as needed. Pain, Disp: , Rfl: ;  omeprazole (PRILOSEC) 20 MG capsule, Take 20 mg by mouth daily as needed. , Disp: , Rfl:  chlorhexidine (PERIDEX) 0.12 % solution, Use as directed 15 mLs in the mouth or throat 2 (two) times daily., Disp: 120 mL, Rfl: 0;  clindamycin (CLEOCIN) 300 MG capsule, Take 1 capsule (300 mg total) by mouth 3 (three) times daily., Disp: 21 capsule, Rfl: 0;  DISCONTD: cetirizine (ZYRTEC) 10 MG tablet, Take 10 mg by mouth daily.  , Disp: , Rfl: ;  DISCONTD: hydrochlorothiazide 25 MG tablet, Take 25 mg by mouth daily.  , Disp: , Rfl:  DISCONTD: sertraline (ZOLOFT) 50 MG tablet, Take 1 tablet (50 mg total) by mouth daily., Disp: 30 tablet, Rfl: 5  EXAM:  Filed Vitals:   08/28/12 0855  BP: 110/80    Pulse: 90  Temp: 99 F (37.2 C)    There is no height on file to calculate BMI.  GENERAL: vitals reviewed and listed above, alert, oriented, appears well hydrated and in no acute distress  HEENT: atraumatic, conjunttiva clear, no obvious abnormalities on inspection of external nose and ears - small pustular lesion on outside gum area new L bottom second to last molar, tenderness with pressur eon tooth  NECK: no obvious masses on inspection  MS: moves all extremities without noticeable abnormality  PSYCH: pleasant and cooperative, no obvious depression or anxiety  ASSESSMENT AND PLAN:  Discussed the following assessment and plan:  1. Gingivitis, acute  chlorhexidine (PERIDEX) 0.12 % solution  2. Tooth abscess  clindamycin (CLEOCIN) 300 MG capsule   -likely more gingival in nature the periapical abscess, but can not rule this out on exam -Patient advised to return or notify a doctor immediately if symptoms worsen or persist or new concerns arise.  Patient Instructions  -use medications as instructed  -notify your dentist and schedule appointment in next several days  -notify a doctor if fevers  -take ibuprofen 600-800mg  twice daily or tylenol 1000mg  twice daily for pain     KIM, HANNAH R.

## 2012-12-15 ENCOUNTER — Ambulatory Visit (INDEPENDENT_AMBULATORY_CARE_PROVIDER_SITE_OTHER): Payer: Federal, State, Local not specified - PPO | Admitting: Family Medicine

## 2012-12-15 ENCOUNTER — Encounter: Payer: Self-pay | Admitting: Family Medicine

## 2012-12-15 VITALS — BP 140/90 | HR 99 | Temp 98.1°F | Wt 239.0 lb

## 2012-12-15 DIAGNOSIS — J069 Acute upper respiratory infection, unspecified: Secondary | ICD-10-CM

## 2012-12-15 MED ORDER — HYDROCODONE-HOMATROPINE 5-1.5 MG/5ML PO SYRP: 5.0000 mL | ORAL_SOLUTION | Freq: Three times a day (TID) | ORAL | Status: DC | PRN

## 2012-12-15 NOTE — Patient Instructions (Addendum)

## 2012-12-15 NOTE — Progress Notes (Signed)
Chief Complaint  Patient presents with  . Cough    nasal congestion, drainage, fever( not over 101), diarrhea     HPI:  Acute visit for feeling sick: Started: 3 days ago -symptoms: cough, nasal congestion, low grade fever, watery diarrhea -denies: ear pain, tooth pain, SOB -sick contacts: none known -has tried: nyquil  ROS: See pertinent positives and negatives per HPI.  Past Medical History  Diagnosis Date  . Hypertension   . Hidradenitis suppurativa     Family History  Problem Relation Age of Onset  . Arthritis Mother   . Leukemia Mother   . Hypertension Father   . Arthritis Maternal Grandmother   . Hypertension Maternal Grandmother     History   Social History  . Marital Status: Married    Spouse Name: N/A    Number of Children: N/A  . Years of Education: N/A   Social History Main Topics  . Smoking status: Current Every Day Smoker -- 0.80 packs/day for 7 years    Types: Cigarettes  . Smokeless tobacco: Never Used  . Alcohol Use: Yes  . Drug Use: No  . Sexually Active: None   Other Topics Concern  . None   Social History Narrative  . None    Current outpatient prescriptions:chlorhexidine (PERIDEX) 0.12 % solution, Use as directed 15 mLs in the mouth or throat 2 (two) times daily., Disp: 120 mL, Rfl: 0;  clindamycin (CLEOCIN) 300 MG capsule, Take 1 capsule (300 mg total) by mouth 3 (three) times daily., Disp: 21 capsule, Rfl: 0;  clomiPHENE (CLOMID) 50 MG tablet, Take 50 mg by mouth daily. As directed per GYN., Disp: , Rfl:  HYDROcodone-homatropine (HYCODAN) 5-1.5 MG/5ML syrup, Take 5 mLs by mouth every 8 (eight) hours as needed for cough., Disp: 120 mL, Rfl: 0;  ibuprofen (ADVIL,MOTRIN) 200 MG tablet, Take 400 mg by mouth every 6 (six) hours as needed. Pain, Disp: , Rfl: ;  omeprazole (PRILOSEC) 20 MG capsule, Take 20 mg by mouth daily as needed. , Disp: , Rfl: ;  [DISCONTINUED] cetirizine (ZYRTEC) 10 MG tablet, Take 10 mg by mouth daily.  , Disp: , Rfl:   [DISCONTINUED] hydrochlorothiazide 25 MG tablet, Take 25 mg by mouth daily.  , Disp: , Rfl: ;  [DISCONTINUED] sertraline (ZOLOFT) 50 MG tablet, Take 1 tablet (50 mg total) by mouth daily., Disp: 30 tablet, Rfl: 5  EXAM:  Filed Vitals:   12/15/12 0849  BP: 140/90  Pulse: 99  Temp: 98.1 F (36.7 C)    Body mass index is 41 kg/(m^2).  GENERAL: vitals reviewed and listed above, alert, oriented, appears well hydrated and in no acute distress  HEENT: atraumatic, conjunttiva clear, no obvious abnormalities on inspection of external nose and ears, normal appearance of ear canals and TMs, clear nasal congestion, mild post oropharyngeal erythema with PND, no tonsillar edema or exudate, no sinus TTP  NECK: no obvious masses on inspection  LUNGS: clear to auscultation bilaterally, no wheezes, rales or rhonchi, good air movement  CV: HRRR, no peripheral edema  MS: moves all extremities without noticeable abnormality  PSYCH: pleasant and cooperative, no obvious depression or anxiety  ASSESSMENT AND PLAN:  Discussed the following assessment and plan:  1. Acute upper respiratory infections of unspecified site  HYDROcodone-homatropine (HYCODAN) 5-1.5 MG/5ML syrup   HYDROcodone-homatropine (HYCODAN) 5-1.5 MG/5ML syrup   -likely viral, supportive care and return precautions. Cough medication provided - discussed risks. -Patient advised to return or notify a doctor immediately if symptoms worsen or  persist or new concerns arise.  Patient Instructions  INSTRUCTIONS FOR UPPER RESPIRATORY INFECTION:  -plenty of rest and fluids  -nasal saline wash 2-3 times daily (use prepackaged nasal saline or bottled/distilled water if making your own)   -can use sinex nasal spray for drainage and nasal congestion - but do NOT use longer then 3-4 days  -can use tylenol or ibuprofen as directed for aches and sorethroat  -in the winter time, using a humidifier at night is helpful (please follow cleaning  instructions)  -if you are taking a cough medication - use only as directed, may also try a teaspoon of honey to coat the throat and throat lozenges  -for sore throat, salt water gargles can help  -follow up if you have fevers, facial pain, tooth pain, difficulty breathing or are worsening or not getting better in 5-7 days      Preeti Winegardner R.

## 2013-01-17 ENCOUNTER — Encounter (HOSPITAL_COMMUNITY): Payer: Self-pay | Admitting: Emergency Medicine

## 2013-01-17 ENCOUNTER — Emergency Department (HOSPITAL_COMMUNITY): Payer: BC Managed Care – PPO

## 2013-01-17 ENCOUNTER — Emergency Department (HOSPITAL_COMMUNITY)
Admission: EM | Admit: 2013-01-17 | Discharge: 2013-01-17 | Disposition: A | Discharge status: Home / Self Care | Payer: BC Managed Care – PPO | Attending: Emergency Medicine | Admitting: Emergency Medicine

## 2013-01-17 DIAGNOSIS — I10 Essential (primary) hypertension: Secondary | ICD-10-CM | POA: Insufficient documentation

## 2013-01-17 DIAGNOSIS — Z872 Personal history of diseases of the skin and subcutaneous tissue: Secondary | ICD-10-CM | POA: Insufficient documentation

## 2013-01-17 DIAGNOSIS — R110 Nausea: Secondary | ICD-10-CM | POA: Insufficient documentation

## 2013-01-17 DIAGNOSIS — M5430 Sciatica, unspecified side: Secondary | ICD-10-CM | POA: Insufficient documentation

## 2013-01-17 DIAGNOSIS — R319 Hematuria, unspecified: Secondary | ICD-10-CM

## 2013-01-17 DIAGNOSIS — F17200 Nicotine dependence, unspecified, uncomplicated: Secondary | ICD-10-CM | POA: Insufficient documentation

## 2013-01-17 DIAGNOSIS — Z3202 Encounter for pregnancy test, result negative: Secondary | ICD-10-CM | POA: Insufficient documentation

## 2013-01-17 DIAGNOSIS — M5432 Sciatica, left side: Secondary | ICD-10-CM

## 2013-01-17 LAB — URINALYSIS, ROUTINE W REFLEX MICROSCOPIC
Bilirubin Urine: NEGATIVE
Glucose, UA: NEGATIVE mg/dL
Ketones, ur: NEGATIVE mg/dL
Nitrite: NEGATIVE
Protein, ur: 30 mg/dL — AB
Specific Gravity, Urine: 1.023 (ref 1.005–1.030)
Urobilinogen, UA: 0.2 mg/dL (ref 0.0–1.0)
pH: 6 (ref 5.0–8.0)

## 2013-01-17 LAB — URINE MICROSCOPIC-ADD ON

## 2013-01-17 MED ORDER — HYDROMORPHONE HCL PF 1 MG/ML IJ SOLN
1.0000 mg | Freq: Once | INTRAMUSCULAR | Status: AC
  Administered 2013-01-17: 1 mg via INTRAMUSCULAR
  Filled 2013-01-17: qty 1

## 2013-01-17 MED ORDER — OXYCODONE-ACETAMINOPHEN 5-325 MG PO TABS
1.0000 | ORAL_TABLET | Freq: Once | ORAL | Status: AC
  Administered 2013-01-17: 1 via ORAL
  Filled 2013-01-17: qty 1

## 2013-01-17 MED ORDER — ONDANSETRON 8 MG PO TBDP
8.0000 mg | ORAL_TABLET | Freq: Once | ORAL | Status: AC
  Administered 2013-01-17: 8 mg via ORAL
  Filled 2013-01-17: qty 1

## 2013-01-17 MED ORDER — CYCLOBENZAPRINE HCL 10 MG PO TABS: 10.0000 mg | ORAL_TABLET | Freq: Two times a day (BID) | ORAL | Status: DC | PRN

## 2013-01-17 MED ORDER — OXYCODONE-ACETAMINOPHEN 5-325 MG PO TABS: 1.0000 | ORAL_TABLET | ORAL | Status: DC | PRN

## 2013-01-17 NOTE — ED Provider Notes (Signed)
History     CSN: [id number]  Arrival date & time 01/17/13  0302   First MD Initiated Contact with Patient 01/17/13 [phone]      Chief Complaint  Patient presents with  . Flank Pain    HPI Patient reports severe pain in her left flank that radiates down towards her left buttock.  She also has had some nausea without vomiting.  She denies diarrhea.  She has a history of low back pain.  She's been doing heavy lifting lately.  No prior history kidney stones.  Her symptoms are mild to moderate in severity.  Her pain started acutely tonight at 4 AM.  She denies other symptoms.  Nothing improves or worsens her symptoms   Past Medical History  Diagnosis Date  . Hypertension   . Hidradenitis suppurativa     Past Surgical History  Procedure Laterality Date  . Cystectomy  2005     removed from tail bone    Family History  Problem Relation Age of Onset  . Arthritis Mother   . Leukemia Mother   . Hypertension Father   . Arthritis Maternal Grandmother   . Hypertension Maternal Grandmother     History  Substance Use Topics  . Smoking status: Current Every Day Smoker -- 0.50 packs/day for 10 years    Types: Cigarettes  . Smokeless tobacco: Never Used  . Alcohol Use: No    OB History   Grav Para Term Preterm Abortions TAB SAB Ect Mult Living                  Review of Systems  All other systems reviewed and are negative.    Allergies  Penicillins and Naproxen  Home Medications   Current Outpatient Rx  Name  Route  Sig  Dispense  Refill  . ibuprofen (ADVIL,MOTRIN) 200 MG tablet   Oral   Take 400 mg by mouth every 6 (six) hours as needed. Pain         . cyclobenzaprine (FLEXERIL) 10 MG tablet   Oral   Take 1 tablet (10 mg total) by mouth 2 (two) times daily as needed for muscle spasms.   20 tablet   0   . oxyCODONE-acetaminophen (PERCOCET/ROXICET) 5-325 MG per tablet   Oral   Take 1 tablet by mouth every 4 (four) hours as needed for pain.   20 tablet   0      BP 124/83  Pulse 72  Temp(Src) 97.9 F (36.6 C) (Oral)  Resp 19  Ht 5\' 4"  (1.626 m)  Wt 225 lb (102.059 kg)  BMI 38.6 kg/m2  SpO2 96%  LMP 01/03/2013  Physical Exam  Nursing note and vitals reviewed. Constitutional: She is oriented to person, place, and time. She appears well-developed and well-nourished. No distress.  HENT:  Head: Normocephalic and atraumatic.  Eyes: EOM are normal.  Neck: Normal range of motion.  Cardiovascular: Normal rate, regular rhythm and normal heart sounds.   Pulmonary/Chest: Effort normal and breath sounds normal.  Abdominal: Soft. She exhibits no distension. There is no tenderness.  Musculoskeletal: Normal range of motion.  Neurological: She is alert and oriented to person, place, and time.  Skin: Skin is warm and dry.  Psychiatric: She has a normal mood and affect. Judgment normal.    ED Course  Procedures (including critical care time)  Labs Reviewed  URINALYSIS, ROUTINE W REFLEX MICROSCOPIC - Abnormal; Notable for the following:    APPearance CLOUDY (*)    Hgb urine  dipstick LARGE (*)    Protein, ur 30 (*)    Leukocytes, UA MODERATE (*)    All other components within normal limits  URINE MICROSCOPIC-ADD ON - Abnormal; Notable for the following:    Squamous Epithelial / LPF FEW (*)    All other components within normal limits  PREGNANCY, URINE   Ct Abdomen Pelvis Wo Contrast  01/17/2013  *RADIOLOGY REPORT*  Clinical Data: Left flank pain radiating to the back.  CT ABDOMEN AND PELVIS WITHOUT CONTRAST  Technique:  Multidetector CT imaging of the abdomen and pelvis was performed following the standard protocol without intravenous contrast.  Comparison: None.  Findings: The lung bases are clear.  The kidneys appear symmetrical in size and shape.  No pyelocaliectasis or ureterectasis.  No renal, ureteral, or bladder stones.  The bladder is decompressed.  The unenhanced appearance of the liver, spleen, pancreas, gallbladder, adrenal glands,  abdominal aorta, and retroperitoneal lymph nodes is unremarkable.  The stomach, small bowel, and colon are not abnormally distended.  No free air or free fluid in the abdomen.  Pelvis:  Uterus and adnexal structures are not enlarged.  No free or loculated pelvic fluid collections.  No significant pelvic lymphadenopathy.  Diverticula in the sigmoid colon without diverticulitis.  The appendix is normal.  Normal alignment of the lumbar vertebrae.  IMPRESSION: No renal or ureteral stone or obstruction.  No acute process identified in the abdomen or pelvis.   Original Report Authenticated By: Burman Nieves, M.D.    I personally reviewed the imaging tests through PACS system I reviewed available ER/hospitalization records through the EMR   1. Sciatica, left   2. Hematuria       MDM  The patient feels much better at this time.  Given her hematuria CT scan was performed to evaluate for ureteral stone.  No obvious stone noted.  The patient feels better.  Discharge with PCP followup.  No lower extremities weakness        Lyanne Co, MD 01/17/13 [phone]

## 2013-01-17 NOTE — ED Notes (Signed)
Pt states she woke up from extreme pain in her left flnk that radiates to her back. Took 800mg  Ibuprofen and a hot compress at home to no avail.

## 2013-01-17 NOTE — ED Notes (Signed)
Patient states she was asleep and woke up in pain 7-8/10, worsening with movement. Patient denies having had a similar pain in the past. Patient informed of need for urine sample, unable to provide at this time. Patient given fluids.

## 2013-01-19 ENCOUNTER — Telehealth: Payer: Self-pay | Admitting: Family Medicine

## 2013-01-19 NOTE — Telephone Encounter (Signed)
We scheduled pt for 1:45 (15 min acute) and blocked the 2:45 same day acute appt to get sick pt in tomorrow, Wed.  FYI

## 2013-01-19 NOTE — Telephone Encounter (Signed)
Pt went to ED this weekend. They think she passed a kidney stone. Pt still has stomach pain, and very nauseous. Pt needs a 30 min OV, but only "same day: on Wed. Thurs there is the same problem, only "same day" appts. Pls advise.

## 2013-01-19 NOTE — Telephone Encounter (Signed)
appt set/kh 

## 2013-01-19 NOTE — Telephone Encounter (Signed)
appt set

## 2013-01-20 ENCOUNTER — Encounter (HOSPITAL_COMMUNITY): Payer: Self-pay | Admitting: Emergency Medicine

## 2013-01-20 ENCOUNTER — Ambulatory Visit (INDEPENDENT_AMBULATORY_CARE_PROVIDER_SITE_OTHER): Payer: Federal, State, Local not specified - PPO | Admitting: Family Medicine

## 2013-01-20 ENCOUNTER — Encounter: Payer: Self-pay | Admitting: Family Medicine

## 2013-01-20 ENCOUNTER — Inpatient Hospital Stay (HOSPITAL_COMMUNITY)
Admission: EM | Admit: 2013-01-20 | Discharge: 2013-01-25 | DRG: 569 | Disposition: A | Discharge status: Home / Self Care | Payer: BC Managed Care – PPO | Attending: Internal Medicine | Admitting: Internal Medicine

## 2013-01-20 VITALS — BP 100/60 | Temp 102.0°F | Wt 225.0 lb

## 2013-01-20 DIAGNOSIS — M545 Low back pain, unspecified: Secondary | ICD-10-CM | POA: Diagnosis present

## 2013-01-20 DIAGNOSIS — Z880 Allergy status to penicillin: Secondary | ICD-10-CM

## 2013-01-20 DIAGNOSIS — R509 Fever, unspecified: Secondary | ICD-10-CM

## 2013-01-20 DIAGNOSIS — L732 Hidradenitis suppurativa: Secondary | ICD-10-CM | POA: Diagnosis present

## 2013-01-20 DIAGNOSIS — E871 Hypo-osmolality and hyponatremia: Secondary | ICD-10-CM

## 2013-01-20 DIAGNOSIS — Z79899 Other long term (current) drug therapy: Secondary | ICD-10-CM

## 2013-01-20 DIAGNOSIS — R52 Pain, unspecified: Secondary | ICD-10-CM

## 2013-01-20 DIAGNOSIS — K219 Gastro-esophageal reflux disease without esophagitis: Secondary | ICD-10-CM

## 2013-01-20 DIAGNOSIS — N12 Tubulo-interstitial nephritis, not specified as acute or chronic: Principal | ICD-10-CM

## 2013-01-20 DIAGNOSIS — R6510 Systemic inflammatory response syndrome (SIRS) of non-infectious origin without acute organ dysfunction: Secondary | ICD-10-CM

## 2013-01-20 DIAGNOSIS — Z791 Long term (current) use of non-steroidal anti-inflammatories (NSAID): Secondary | ICD-10-CM

## 2013-01-20 DIAGNOSIS — D6959 Other secondary thrombocytopenia: Secondary | ICD-10-CM | POA: Diagnosis present

## 2013-01-20 DIAGNOSIS — F17200 Nicotine dependence, unspecified, uncomplicated: Secondary | ICD-10-CM | POA: Diagnosis present

## 2013-01-20 DIAGNOSIS — R109 Unspecified abdominal pain: Secondary | ICD-10-CM

## 2013-01-20 DIAGNOSIS — A498 Other bacterial infections of unspecified site: Secondary | ICD-10-CM | POA: Diagnosis present

## 2013-01-20 DIAGNOSIS — Z888 Allergy status to other drugs, medicaments and biological substances status: Secondary | ICD-10-CM

## 2013-01-20 DIAGNOSIS — D509 Iron deficiency anemia, unspecified: Secondary | ICD-10-CM | POA: Diagnosis present

## 2013-01-20 DIAGNOSIS — M5430 Sciatica, unspecified side: Secondary | ICD-10-CM | POA: Diagnosis present

## 2013-01-20 DIAGNOSIS — R112 Nausea with vomiting, unspecified: Secondary | ICD-10-CM

## 2013-01-20 HISTORY — DX: Hypo-osmolality and hyponatremia: E87.1

## 2013-01-20 HISTORY — DX: Systemic inflammatory response syndrome (sirs) of non-infectious origin without acute organ dysfunction: R65.10

## 2013-01-20 LAB — COMPREHENSIVE METABOLIC PANEL
ALT: 13 U/L (ref 0–35)
Albumin: 3.1 g/dL — ABNORMAL LOW (ref 3.5–5.2)
Alkaline Phosphatase: 87 U/L (ref 39–117)
BUN: 9 mg/dL (ref 6–23)
Chloride: 96 mEq/L (ref 96–112)
Potassium: 3.5 mEq/L (ref 3.5–5.1)
Sodium: 132 mEq/L — ABNORMAL LOW (ref 135–145)
Total Bilirubin: 0.6 mg/dL (ref 0.3–1.2)
Total Protein: 7.9 g/dL (ref 6.0–8.3)

## 2013-01-20 LAB — URINALYSIS, MICROSCOPIC ONLY
Bilirubin Urine: NEGATIVE
Glucose, UA: NEGATIVE mg/dL
Ketones, ur: 40 mg/dL — AB
Nitrite: POSITIVE — AB
Specific Gravity, Urine: 1.017 (ref 1.005–1.030)
pH: 6 (ref 5.0–8.0)

## 2013-01-20 LAB — CBC WITH DIFFERENTIAL/PLATELET
Basophils Relative: 0 % (ref 0–1)
Eosinophils Absolute: 0 10*3/uL (ref 0.0–0.7)
HCT: 38.5 % (ref 36.0–46.0)
Lymphocytes Relative: 7 % — ABNORMAL LOW (ref 12–46)
MCH: 25.8 pg — ABNORMAL LOW (ref 26.0–34.0)
MCV: 80 fL (ref 78.0–100.0)
Monocytes Absolute: 1 10*3/uL (ref 0.1–1.0)
RDW: 15.5 % (ref 11.5–15.5)
WBC: 12.3 10*3/uL — ABNORMAL HIGH (ref 4.0–10.5)

## 2013-01-20 LAB — CG4 I-STAT (LACTIC ACID): Lactic Acid, Venous: 1.25 mmol/L (ref 0.5–2.2)

## 2013-01-20 LAB — POCT PREGNANCY, URINE: Preg Test, Ur: NEGATIVE

## 2013-01-20 LAB — LIPASE, BLOOD: Lipase: 11 U/L (ref 11–59)

## 2013-01-20 MED ORDER — SODIUM CHLORIDE 0.9 % IV SOLN
INTRAVENOUS | Status: DC
  Administered 2013-01-20 – 2013-01-25 (×10): via INTRAVENOUS

## 2013-01-20 MED ORDER — SODIUM CHLORIDE 0.9 % IV BOLUS (SEPSIS)
1000.0000 mL | Freq: Once | INTRAVENOUS | Status: AC
  Administered 2013-01-20: 1000 mL via INTRAVENOUS

## 2013-01-20 MED ORDER — LEVOFLOXACIN IN D5W 750 MG/150ML IV SOLN
750.0000 mg | INTRAVENOUS | Status: DC
  Administered 2013-01-21 – 2013-01-23 (×3): 750 mg via INTRAVENOUS
  Filled 2013-01-20 (×4): qty 150

## 2013-01-20 MED ORDER — ACETAMINOPHEN 325 MG PO TABS
650.0000 mg | ORAL_TABLET | Freq: Once | ORAL | Status: AC
  Administered 2013-01-20: 650 mg via ORAL
  Filled 2013-01-20: qty 1

## 2013-01-20 MED ORDER — MORPHINE SULFATE 4 MG/ML IJ SOLN
4.0000 mg | Freq: Once | INTRAMUSCULAR | Status: AC
  Administered 2013-01-20: 4 mg via INTRAVENOUS
  Filled 2013-01-20: qty 1

## 2013-01-20 MED ORDER — ACETAMINOPHEN 650 MG RE SUPP
650.0000 mg | Freq: Four times a day (QID) | RECTAL | Status: DC | PRN
  Administered 2013-01-21 (×2): 650 mg via RECTAL
  Filled 2013-01-20 (×2): qty 1

## 2013-01-20 MED ORDER — CYCLOBENZAPRINE HCL 10 MG PO TABS
10.0000 mg | ORAL_TABLET | Freq: Two times a day (BID) | ORAL | Status: DC | PRN
  Filled 2013-01-20: qty 1

## 2013-01-20 MED ORDER — OXYCODONE-ACETAMINOPHEN 5-325 MG PO TABS
1.0000 | ORAL_TABLET | ORAL | Status: DC | PRN
  Administered 2013-01-22 (×2): 1 via ORAL
  Filled 2013-01-20 (×2): qty 1

## 2013-01-20 MED ORDER — ACETAMINOPHEN 325 MG PO TABS
650.0000 mg | ORAL_TABLET | Freq: Four times a day (QID) | ORAL | Status: DC | PRN
  Administered 2013-01-23: 650 mg via ORAL
  Filled 2013-01-20: qty 2

## 2013-01-20 MED ORDER — CIPROFLOXACIN IN D5W 400 MG/200ML IV SOLN
400.0000 mg | Freq: Once | INTRAVENOUS | Status: AC
  Administered 2013-01-20: 400 mg via INTRAVENOUS
  Filled 2013-01-20: qty 200

## 2013-01-20 MED ORDER — ONDANSETRON 8 MG PO TBDP
8.0000 mg | ORAL_TABLET | Freq: Once | ORAL | Status: AC
  Administered 2013-01-20: 8 mg via ORAL
  Filled 2013-01-20: qty 1

## 2013-01-20 MED ORDER — PANTOPRAZOLE SODIUM 40 MG PO TBEC
40.0000 mg | DELAYED_RELEASE_TABLET | Freq: Every day | ORAL | Status: DC
  Administered 2013-01-20: 40 mg via ORAL
  Filled 2013-01-20 (×2): qty 1

## 2013-01-20 MED ORDER — PROMETHAZINE HCL 25 MG/ML IJ SOLN
12.5000 mg | Freq: Once | INTRAMUSCULAR | Status: AC
  Administered 2013-01-20: 12.5 mg via INTRAVENOUS
  Filled 2013-01-20: qty 1

## 2013-01-20 MED ORDER — MORPHINE SULFATE 2 MG/ML IJ SOLN
1.0000 mg | INTRAMUSCULAR | Status: DC | PRN
  Administered 2013-01-20 – 2013-01-24 (×21): 2 mg via INTRAVENOUS
  Filled 2013-01-20 (×14): qty 1
  Filled 2013-01-20: qty 2
  Filled 2013-01-20 (×7): qty 1

## 2013-01-20 NOTE — Progress Notes (Signed)
Subjective:    Patient ID: Jill Peters, female    DOB: [birth year], 32 y.o.   MRN: [id number]  HPI Emergency room followup. Patient was evaluated on 01/17/2013 with severe left flank pain. She had some nausea without vomiting. At that point she did not have any fever. Vital signs were all stable. Patient had urinalysis which revealed large hemoglobin and moderate leukocytes. CT abdomen and pelvis revealed no acute process.  No evidence for kidney stone. Patient was given pain medication and sent home with Percocet.  Monday she developed fever. Progressive left flank pain. No burning with urination but some urine frequency. She denies any cough, sore throat, nasal congestion, diarrhea, or any myalgias. She is still having severe nausea and vomiting today and took last pain medication last night. Fever up to 102.8 today and took ibuprofen 800 mg at 11 AM today  Past Medical History  Diagnosis Date  . Hypertension   . Hidradenitis suppurativa    Past Surgical History  Procedure Laterality Date  . Cystectomy  2005     removed from tail bone    reports that she has been smoking Cigarettes.  She has a 5 pack-year smoking history. She has never used smokeless tobacco. She reports that she does not drink alcohol or use illicit drugs. family history includes Arthritis in her maternal grandmother and mother; Hypertension in her father and maternal grandmother; and Leukemia in her mother. Allergies  Allergen Reactions  . Penicillins Anaphylaxis  . Naproxen Nausea Only      Review of Systems  Constitutional: Positive for fever, chills and fatigue.  HENT: Negative for congestion and sore throat.   Respiratory: Negative for cough.   Cardiovascular: Negative for chest pain.  Gastrointestinal: Positive for nausea and vomiting. Negative for diarrhea, blood in stool and abdominal distention.  Genitourinary: Positive for frequency. Negative for dysuria and hematuria.  Musculoskeletal:  Positive for back pain (Left flank pain).  Neurological: Negative for dizziness and headaches.       Objective:   Physical Exam  Constitutional: She is oriented to person, place, and time. She appears well-developed and well-nourished.  Patient appears ill but nontoxic  HENT:  Right Ear: External ear normal.  Left Ear: External ear normal.  Mouth/Throat: Oropharynx is clear and moist.  Neck: Neck supple.  Cardiovascular:  Tachycardic with heart rate around 120 but regular  Pulmonary/Chest: Effort normal and breath sounds normal. No respiratory distress. She has no wheezes. She has no rales.  Abdominal: Soft. She exhibits no distension and no mass. There is no tenderness. There is no rebound and no guarding.  Somewhat hypoactive bowel sounds  Musculoskeletal:  Left flank tenderness  Lymphadenopathy:    She has no cervical adenopathy.  Neurological: She is alert and oriented to person, place, and time.  Skin: No rash noted.  Skin is very warm to touch but no rash          Assessment & Plan:  Patient presents with progressive left flank pain, fever, recurrent vomiting, and recent urinalysis through emergency department with pyuria. Concern is for ?evolving left pyelonephritis. Recent CT abdomen and pelvis did not show any evidence for this but she was afebrile then and symptoms had just started.. Given her recurrent vomiting, tachycardia, low blood pressure outpatient treatment is not warranted at this time. Will send to emergency department for further evaluation and she will likely require at least brief admission. The center in [place] and an and and were called the EMS that is  the only thing that is doing with his

## 2013-01-20 NOTE — ED Notes (Signed)
States that she was told to return to the ER by her PCP due to a possible kidney infection. States that she was told that she had blood in her urine. Complaints of back pain, vomiting, and nausea

## 2013-01-20 NOTE — H&P (Addendum)
[facility] History and Physical  Jill Peters WUJ:[id number] DOB: [birth year] DOA: 01/20/2013  Referring physician: Dr. Rubin Payor PCP: Kristian Covey, MD  Specialists: none  Chief Complaint: Nausea vomiting and fevers  HPI: Jill Peters is a 32 y.o. female with history of low back pain/sciatica who presents with above complaints. She states that 3 days ago she began having increased left flank pain and came to the ED, a CT scan of the abdomen and pelvis was done and was negative for any acute process also negative for stones, urinalysis at that time showed 7-10 WBCs and numerous RBCs and moderate  leukocyte esterase-not cultures sent. She was treated for sciatica-discharged on oral narcotics. Since she was sent home she reports she developed nausea vomiting and fevers up to 103 and so went to see her PCP today and was asked to come back to the ED. Upon return to the ED urinalysis was again consistent with a UTI, she had fevers 102, initially at tachycardic and had a white cell count of 12.3. She was started on IV fluids and her heart rate improved to 90, hospitalist asked to admit patient for IV antibiotics. Patient also admits to hematuria   Review of Systems: The patient denies , weight loss,, vision loss, decreased hearing, hoarseness, chest pain, syncope, dyspnea on exertion, peripheral edema, balance deficits, hemoptysis, melena, hematochezia, severe indigestion/heartburn, incontinence, , muscle weakness, suspicious skin lesions, transient blindness, difficulty walking, depression, unusual weight change.    Past Medical History  Diagnosis Date  . Hypertension - pt on meds denies h/o    . Hidradenitis suppurativa      .GERD Past Surgical History  Procedure Laterality Date  . Cystectomy  2005     removed from tail bone   Social History:  reports that she has been smoking Cigarettes.  She has a 5 pack-year smoking history. She has never used smokeless tobacco. She reports  that she does not drink alcohol or use illicit drugs. where does patient live--home  Can patient participate in ADLs-yes  Allergies  Allergen Reactions  . Penicillins Anaphylaxis  . Naproxen Nausea Only    Family History  Problem Relation Age of Onset  . Arthritis Mother   . Leukemia Mother   . Hypertension Father   . Arthritis Maternal Grandmother   . Hypertension Maternal Grandmother     Prior to Admission medications   Medication Sig Start Date End Date Taking? Authorizing Provider  cyclobenzaprine (FLEXERIL) 10 MG tablet Take 1 tablet (10 mg total) by mouth 2 (two) times daily as needed for muscle spasms. 01/17/13  Yes Lyanne Co, MD  ibuprofen (ADVIL,MOTRIN) 200 MG tablet Take 400 mg by mouth every 6 (six) hours as needed. Pain   Yes Historical Provider, MD  oxyCODONE-acetaminophen (PERCOCET/ROXICET) 5-325 MG per tablet Take 1 tablet by mouth every 4 (four) hours as needed for pain. 01/17/13  Yes Lyanne Co, MD   Physical Exam: Filed Vitals:   01/20/13 1439 01/20/13 1634 01/20/13 1741 01/20/13 1941  BP: 137/81 164/85 157/98 143/73  Pulse: 129  125 90  Temp: 101.2 F (38.4 C) 99.7 F (37.6 C) 101.7 F (38.7 C) 101.8 F (38.8 C)  TempSrc: Oral Oral Oral Oral  Resp: 25 20 20 20   SpO2: 99% 100% 98% 98%    Constitutional: Vital signs reviewed.  Patient is a well-developed and well-nourishedin no acute distress and cooperative with exam. Alert and oriented x3.  Head: Normocephalic and atraumatic Ear: TM normal bilaterally Mouth: no  erythema or exudates, MMM Eyes: PERRL, EOMI, conjunctivae normal, No scleral icterus.  Neck: Supple, Trachea midline normal ROM, No JVD, mass, thyromegaly, or carotid bruit present.  Cardiovascular: RRR, S1 normal, S2 normal, no MRG, pulses symmetric and intact bilaterally Pulmonary/Chest: CTAB, no wheezes, rales, or rhonchi Abdominal: Soft. Non-tender, non-distended, bowel sounds are normal, no masses, organomegaly, or guarding  present.  GU: Left CVA tenderness present  Extremities: No cyanosis and no edema  Neurological: A&O x3, Strength is normal and symmetric bilaterally, cranial nerve II-XII are grossly intact, no focal motor deficit, sensory intact to light touch bilaterally.  Skin: Warm, dry and intact. No rash, cyanosis, or clubbing.  Psychiatric: Normal mood and affect. speech and behavior is normal. Judgment and thought content normal. Cognition and memory are normal.    Labs on Admission:  Basic Metabolic Panel:  Recent Labs Lab 01/20/13 1545  NA 132*  K 3.5  CL 96  CO2 24  GLUCOSE 134*  BUN 9  CREATININE 0.71  CALCIUM 9.0   Liver Function Tests:  Recent Labs Lab 01/20/13 1545  AST 13  ALT 13  ALKPHOS 87  BILITOT 0.6  PROT 7.9  ALBUMIN 3.1*    Recent Labs Lab 01/20/13 1545  LIPASE 11   No results found for this basename: AMMONIA,  in the last 168 hours CBC:  Recent Labs Lab 01/20/13 1545  WBC 12.3*  NEUTROABS 10.4*  HGB 12.4  HCT 38.5  MCV 80.0  PLT 132*   Cardiac Enzymes: No results found for this basename: CKTOTAL, CKMB, CKMBINDEX, TROPONINI,  in the last 168 hours  BNP (last 3 results) No results found for this basename: PROBNP,  in the last 8760 hours CBG: No results found for this basename: GLUCAP,  in the last 168 hours  Radiological Exams on Admission: No results found.    Assessment/Plan Active Problems:  Pyelonephritis/UTI -As discussed above, will obtain blood cultures -Empiric antibiotics with Levaquin (she is allergic to penicillin and has does not remember taking cephalosporins) -Supportive care--antiemetics, IV fluids, pain management and follow up. -As discussed above patient had CT scan on 316 which was negative for stones, no acute findings.   SIRS (systemic inflammatory response syndrome) -Secondary to above, presenting with tachycardia fever leukocytosis. BP stable -Blood cultures as above, IV fluids, empiric antibiotics and follow    Hyponatremia -Likely secondary to volume depletion-asabove patient has had nausea and vomiting   H/o GERD (gastroesophageal reflux disease) -Place on PPI History of low back pain/sciatica -Continue pain management.     Code Status: full Family Communication: pt alone at bedside Disposition Plan: admit to med bed  Time spent: >60min  Kela Millin [facility] Pager [phone]  If 7PM-7AM, please contact night-coverage [email or web address] Password [facility] 01/20/2013, 8:04 PM

## 2013-01-20 NOTE — Patient Instructions (Addendum)
Go directly to ER at [facility]

## 2013-01-20 NOTE — Progress Notes (Addendum)
ANTIBIOTIC CONSULT NOTE - INITIAL  Pharmacy Consult for Levaquin Indication: r/o UTI/pyelonephritis  Allergies  Allergen Reactions  . Penicillins Anaphylaxis  . Naproxen Nausea Only    Patient Measurements: Height: 5\' 4"  (162.6 cm) Weight: 228 lb 9.9 oz (103.7 kg) IBW/kg (Calculated) : 54.7   Vital Signs: Temp: 98.7 F (37.1 C) (03/19 2032) Temp src: Oral (03/19 2032) BP: 138/89 mmHg (03/19 2032) Pulse Rate: 122 (03/19 2032) Intake/Output from previous day:   Intake/Output from this shift: Total I/O In: 1000 [I.V.:1000] Out: -   Labs:  Recent Labs  01/20/13 1545  WBC 12.3*  HGB 12.4  PLT 132*  CREATININE 0.71   Estimated Creatinine Clearance: 120.6 ml/min (by C-G formula based on Cr of 0.71). No results found for this basename: VANCOTROUGH, VANCOPEAK, VANCORANDOM, GENTTROUGH, GENTPEAK, GENTRANDOM, TOBRATROUGH, TOBRAPEAK, TOBRARND, AMIKACINPEAK, AMIKACINTROU, AMIKACIN,  in the last 72 hours   Microbiology: No results found for this or any previous visit (from the past 720 hour(s)).  Medical History: Past Medical History  Diagnosis Date  . Hypertension   . Hidradenitis suppurativa     Medications:  Prescriptions prior to admission  Medication Sig Dispense Refill  . cyclobenzaprine (FLEXERIL) 10 MG tablet Take 1 tablet (10 mg total) by mouth 2 (two) times daily as needed for muscle spasms.  20 tablet  0  . ibuprofen (ADVIL,MOTRIN) 200 MG tablet Take 400 mg by mouth every 6 (six) hours as needed. Pain      . oxyCODONE-acetaminophen (PERCOCET/ROXICET) 5-325 MG per tablet Take 1 tablet by mouth every 4 (four) hours as needed for pain.  20 tablet  0   Anti-infectives   Start     Dose/Rate Route Frequency Ordered Stop   01/20/13 2200  levofloxacin (LEVAQUIN) IVPB 750 mg     750 mg 100 mL/hr over 90 Minutes Intravenous Every 24 hours 01/20/13 2047     01/20/13 1715  ciprofloxacin (CIPRO) IVPB 400 mg     400 mg 200 mL/hr over 60 Minutes Intravenous  Once  01/20/13 1708 01/20/13 1844     Assessment:  31yo F admitted with nausea, abdominal pain, flank pain and fever.  Received Cipro 400mg  IV x 1 in the ER at 6pm.  Pharmacy asked to dose Levaquin.   CrCl >100.  Goal of Therapy:  Appropriate dose for renal function.  Plan:   Levaquin 750mg  IV q24h - start 12hrs after Cipro dose.  Dosage adjustment not likely to be necessary so pharmacy will sign-off.  Charolotte Eke, PharmD, pager [phone]. 01/20/2013,8:46 PM.

## 2013-01-20 NOTE — ED Provider Notes (Signed)
History     CSN: [id number]  Arrival date & time 01/20/13  1432   First MD Initiated Contact with Patient 01/20/13 1526      Chief Complaint  Patient presents with  . Nausea  . Abdominal Pain    (Consider location/radiation/quality/duration/timing/severity/associated sxs/prior treatment) Patient is a 32 y.o. female presenting with abdominal pain. The history is provided by the patient.  Abdominal Pain Associated symptoms: fatigue, fever and nausea   Associated symptoms: no chest pain, no diarrhea, no shortness of breath, no vaginal bleeding, no vaginal discharge and no vomiting    patient's had left flank and back pain for the last 3 days. She was seen in the ER and diagnosed with sciatica. At that time she had some white and red cells in the urine without bacteria. She had negative CT also. She states the next day she began to develop fevers. She's had nausea without vomiting. No diarrhea constipation. The pain has been getting more severe. She states that she feels weak all over. She has some pain with urination and feels as if she has to go more frequently. She was seen by her primary care doctor is sent in to the ER. No clear sick contacts. She denies pregnancy. No vaginal bleeding or discharge.  Past Medical History  Diagnosis Date  . Hypertension   . Hidradenitis suppurativa     Past Surgical History  Procedure Laterality Date  . Cystectomy  2005     removed from tail bone    Family History  Problem Relation Age of Onset  . Arthritis Mother   . Leukemia Mother   . Hypertension Father   . Arthritis Maternal Grandmother   . Hypertension Maternal Grandmother     History  Substance Use Topics  . Smoking status: Current Every Day Smoker -- 0.50 packs/day for 10 years    Types: Cigarettes  . Smokeless tobacco: Never Used  . Alcohol Use: No    OB History   Grav Para Term Preterm Abortions TAB SAB Ect Mult Living                  Review of Systems   Constitutional: Positive for fever, appetite change and fatigue. Negative for activity change.  HENT: Negative for neck stiffness.   Eyes: Negative for pain.  Respiratory: Negative for chest tightness and shortness of breath.   Cardiovascular: Negative for chest pain and leg swelling.  Gastrointestinal: Positive for nausea and abdominal pain. Negative for vomiting and diarrhea.  Genitourinary: Positive for flank pain. Negative for vaginal bleeding, vaginal discharge, genital sores, vaginal pain and menstrual problem.  Musculoskeletal: Negative for back pain.  Skin: Negative for rash.  Neurological: Negative for weakness, numbness and headaches.  Psychiatric/Behavioral: Negative for behavioral problems.    Allergies  Penicillins and Naproxen  Home Medications   No current outpatient prescriptions on file.  BP 138/89  Pulse 122  Temp(Src) 98.7 F (37.1 C) (Oral)  Resp 20  Ht 5\' 4"  (1.626 m)  Wt 228 lb 9.9 oz (103.7 kg)  BMI 39.22 kg/m2  SpO2 99%  LMP 01/03/2013  Physical Exam  Nursing note and vitals reviewed. Constitutional: She is oriented to person, place, and time. She appears well-developed and well-nourished.  Patient is febrile  HENT:  Head: Normocephalic and atraumatic.  Eyes: EOM are normal. Pupils are equal, round, and reactive to light.  Neck: Normal range of motion. Neck supple.  Cardiovascular: Regular rhythm and normal heart sounds.   No murmur heard.  Tachycardia  Pulmonary/Chest: Effort normal and breath sounds normal. No respiratory distress. She has no wheezes. She has no rales.  Abdominal: Soft. Bowel sounds are normal. She exhibits no distension. There is tenderness. There is no rebound and no guarding.  Mild left upper quadrant tenderness.  Genitourinary:  CVA tenderness on left.  Musculoskeletal: Normal range of motion.  Neurological: She is alert and oriented to person, place, and time. No cranial nerve deficit.  Skin: Skin is warm and dry.   Psychiatric: She has a normal mood and affect. Her speech is normal.    ED Course  Procedures (including critical care time)  Labs Reviewed  CBC WITH DIFFERENTIAL - Abnormal; Notable for the following:    WBC 12.3 (*)    MCH 25.8 (*)    Platelets 132 (*)    Neutrophils Relative 85 (*)    Neutro Abs 10.4 (*)    Lymphocytes Relative 7 (*)    All other components within normal limits  COMPREHENSIVE METABOLIC PANEL - Abnormal; Notable for the following:    Sodium 132 (*)    Glucose, Bld 134 (*)    Albumin 3.1 (*)    All other components within normal limits  URINALYSIS, MICROSCOPIC ONLY - Abnormal; Notable for the following:    Color, Urine AMBER (*)    APPearance CLOUDY (*)    Hgb urine dipstick SMALL (*)    Ketones, ur 40 (*)    Protein, ur 100 (*)    Nitrite POSITIVE (*)    Leukocytes, UA MODERATE (*)    Bacteria, UA MANY (*)    Squamous Epithelial / LPF FEW (*)    All other components within normal limits  URINE CULTURE  CULTURE, BLOOD (ROUTINE X 2)  CULTURE, BLOOD (ROUTINE X 2)  LIPASE, BLOOD  BASIC METABOLIC PANEL  CBC  POCT PREGNANCY, URINE  CG4 I-STAT (LACTIC ACID)   No results found.   1. Pyelonephritis   2. GERD (gastroesophageal reflux disease)   3. Hyponatremia   4. SIRS (systemic inflammatory response syndrome)       MDM  Patient with pyelonephritis. Has served his been able to tolerate orals. Pain control is also an issue. She will be admitted to internal medicine        Juliet Rude. Rubin Payor, MD 01/21/13 1610

## 2013-01-20 NOTE — ED Notes (Signed)
Hospitalist at bedside 

## 2013-01-20 NOTE — ED Notes (Signed)
Two IV attempts without success.  IV team called.

## 2013-01-21 LAB — CBC
HCT: 33.2 % — ABNORMAL LOW (ref 36.0–46.0)
MCHC: 32.8 g/dL (ref 30.0–36.0)
RDW: 15.8 % — ABNORMAL HIGH (ref 11.5–15.5)

## 2013-01-21 LAB — BASIC METABOLIC PANEL
BUN: 6 mg/dL (ref 6–23)
Calcium: 8.1 mg/dL — ABNORMAL LOW (ref 8.4–10.5)
Chloride: 100 mEq/L (ref 96–112)
Creatinine, Ser: 0.7 mg/dL (ref 0.50–1.10)
GFR calc Af Amer: >90 mL/min (ref >90)
GFR calc non Af Amer: >90 mL/min (ref >90)

## 2013-01-21 MED ORDER — BOOST / RESOURCE BREEZE PO LIQD
1.0000 | Freq: Two times a day (BID) | ORAL | Status: DC
  Administered 2013-01-24 – 2013-01-25 (×3): 1 via ORAL

## 2013-01-21 MED ORDER — PANTOPRAZOLE SODIUM 40 MG PO TBEC
40.0000 mg | DELAYED_RELEASE_TABLET | Freq: Two times a day (BID) | ORAL | Status: DC
  Administered 2013-01-21 – 2013-01-24 (×5): 40 mg via ORAL
  Filled 2013-01-21 (×9): qty 1

## 2013-01-21 MED ORDER — ONDANSETRON HCL 4 MG/2ML IJ SOLN
4.0000 mg | Freq: Four times a day (QID) | INTRAMUSCULAR | Status: DC | PRN
  Administered 2013-01-21 – 2013-01-24 (×12): 4 mg via INTRAVENOUS
  Filled 2013-01-21 (×12): qty 2

## 2013-01-21 MED ORDER — PROMETHAZINE HCL 25 MG/ML IJ SOLN
12.5000 mg | Freq: Four times a day (QID) | INTRAMUSCULAR | Status: DC | PRN
  Administered 2013-01-21 – 2013-01-22 (×3): 12.5 mg via INTRAVENOUS
  Filled 2013-01-21 (×4): qty 1

## 2013-01-21 NOTE — Progress Notes (Signed)
INITIAL NUTRITION ASSESSMENT  DOCUMENTATION CODES Per approved criteria  -Obesity Unspecified   INTERVENTION: Provide Resource Breeze po BID, each supplement provides 250 kcal and 9 grams of protein. Advance diet per MD discretion  NUTRITION DIAGNOSIS: Inadequate oral intake related to nausea and vomiting as evidenced by pt report of no food intake for 3 days and 3% wt loss.  Goal: Pt to meet >/= 90% of their estimated nutrition needs  Monitor:  Po intake Diet advancement Wt Labs  Reason for Assessment: MST  32 y.o. female  Admitting Dx: Nausea, vomiting, fevers  ASSESSMENT: 32 y.o. female with history of low back pain/sciatica who presents with above complaints. Patient also admits to hematuria. Pt states she has not been able to eat anything since Monday due to nausea/vomiting. Pt states that today is the first time she has been able to keep anything down. Pt states nausea improved today after receiving two different anti-nausea medications. Pt states she was eating well prior to Monday with a good appetite. Pt does not follow any special diet and has no questions or concerns at this time. Per pt, usual body weight is between 230 and 240 lbs.   Height: Ht Readings from Last 1 Encounters:  01/20/13 5\' 4"  (1.626 m)    Weight: Wt Readings from Last 1 Encounters:  01/20/13 228 lb 9.9 oz (103.7 kg)    Ideal Body Weight: 120 lbs  % Ideal Body Weight: 190%  Wt Readings from Last 10 Encounters:  01/20/13 228 lb 9.9 oz (103.7 kg)  01/20/13 225 lb (102.059 kg)  01/17/13 225 lb (102.059 kg)  12/15/12 239 lb (108.41 kg)  08/28/12 247 lb (112.038 kg)  07/07/12 238 lb (107.956 kg)  06/12/12 231 lb (104.781 kg)  05/22/12 238 lb (107.956 kg)  03/11/12 238 lb (107.956 kg)  08/12/11 241 lb (109.317 kg)    Usual Body Weight: 235 lbs  % Usual Body Weight: 97%  BMI:  Body mass index is 39.22 kg/(m^2).  Estimated Nutritional Needs: Kcal: 2160-2520 Protein: 83-103  grams Fluid: > 2.2 L  Skin: WDL  Diet Order: Clear Liquid  EDUCATION NEEDS: -No education needs identified at this time   Intake/Output Summary (Last 24 hours) at 01/21/13 1046 Last data filed at 01/21/13 0733  Gross per 24 hour  Intake 3716.67 ml  Output    301 ml  Net 3415.67 ml    Last BM: 3/19  Labs:   Recent Labs Lab 01/20/13 1545 01/21/13 0458  NA 132* 132*  K 3.5 3.7  CL 96 100  CO2 24 22  BUN 9 6  CREATININE 0.71 0.70  CALCIUM 9.0 8.1*  GLUCOSE 134* 124*    CBG (last 3)  No results found for this basename: GLUCAP,  in the last 72 hours  Scheduled Meds: . levofloxacin (LEVAQUIN) IV  750 mg Intravenous Q24H  . pantoprazole  40 mg Oral Daily    Continuous Infusions: . sodium chloride 125 mL/hr at 01/20/13 2037    Past Medical History  Diagnosis Date  . Hypertension   . Hidradenitis suppurativa     Past Surgical History  Procedure Laterality Date  . Cystectomy  2005     removed from tail bone    Ian Malkin RD, LDN Inpatient Clinical Dietitian Pager: [phone] After Hours Pager: [phone]

## 2013-01-21 NOTE — Progress Notes (Signed)
   CARE MANAGEMENT NOTE 01/21/2013  Patient:  Jill Peters, Jill Peters   Account Number:  [id number]  Date Initiated:  01/21/2013  Documentation initiated by:  Jiles Crocker  Subjective/Objective Assessment:   ADMITTED WITH PYELONEPHRITIS     Action/Plan:   PCP: Kristian Covey, MD  LIVES AT HOME WITH SPOUSE;   Anticipated DC Date:  01/26/2013   Anticipated DC Plan:  HOME/SELF CARE      DC Planning Services  CM consult         Status of service:  In process, will continue to follow Medicare Important Message given?  NA - LOS <3 / Initial given by admissions (If response is "NO", the following Medicare IM given date fields will be blank) Per UR Regulation:  Reviewed for med. necessity/level of care/duration of stay Comments:  01/21/2013- B Delan Ksiazek RN,BSN,MHA

## 2013-01-21 NOTE — Progress Notes (Signed)
[facility] PROGRESS NOTE  Jill Peters ZOX:[id number] DOB: 08/11/1981 DOA: 01/20/2013 PCP: Kristian Covey, MD  Assessment/Plan: 1. Pyelonephritis: Continue with Levaquin, IV fluids. UA growing E coli. 2. Nausea, Vomiting: Probably related to pyelonephritis. Continue with phenergan, Zofran. Change protonix to BID.  3. Anemia: Will check anemia panel. Likely iron deficiency.  Code Status: full Family Communication: Husband at bedside care discussed with him.  Disposition Plan: home when stable   Consultants:  none  Procedures:  none  Antibiotics:  Levaquin 2-20  HPI/Subjective: Patient still with nausea and vomiting. Feeling better than yesterday,   Objective: Filed Vitals:   01/21/13 0232 01/21/13 0401 01/21/13 1225 01/21/13 1307  BP:  127/82    Pulse: 90 112    Temp:  100.3 F (37.9 C) 102.7 F (39.3 C) 100.1 F (37.8 C)  TempSrc:  Oral Oral Oral  Resp:  18    Height:      Weight:      SpO2:  100%      Intake/Output Summary (Last 24 hours) at 01/21/13 1348 Last data filed at 01/21/13 1207  Gross per 24 hour  Intake 3716.67 ml  Output   1001 ml  Net 2715.67 ml   Filed Weights   01/20/13 2032  Weight: 103.7 kg (228 lb 9.9 oz)    Exam:   General:  No distress  Cardiovascular: S1, S 2 RRR  Respiratory: CTA  Abdomen: bs present, soft, nt  Musculoskeletal: no edema   Data Reviewed: Basic Metabolic Panel:  Recent Labs Lab 01/20/13 1545 01/21/13 0458  NA 132* 132*  K 3.5 3.7  CL 96 100  CO2 24 22  GLUCOSE 134* 124*  BUN 9 6  CREATININE 0.71 0.70  CALCIUM 9.0 8.1*   Liver Function Tests:  Recent Labs Lab 01/20/13 1545  AST 13  ALT 13  ALKPHOS 87  BILITOT 0.6  PROT 7.9  ALBUMIN 3.1*    Recent Labs Lab 01/20/13 1545  LIPASE 11   No results found for this basename: AMMONIA,  in the last 168 hours CBC:  Recent Labs Lab 01/20/13 1545 01/21/13 0458  WBC 12.3* 7.9  NEUTROABS 10.4*  --   HGB 12.4 10.9*  HCT  38.5 33.2*  MCV 80.0 79.6  PLT 132* 87*   Cardiac Enzymes: No results found for this basename: CKTOTAL, CKMB, CKMBINDEX, TROPONINI,  in the last 168 hours BNP (last 3 results) No results found for this basename: PROBNP,  in the last 8760 hours CBG: No results found for this basename: GLUCAP,  in the last 168 hours  Recent Results (from the past 240 hour(s))  URINE CULTURE     Status: None   Collection Time    01/20/13  4:44 PM      Result Value Range Status   Specimen Description URINE, CLEAN CATCH   Final   Special Requests NONE   Final   Culture  Setup Time 01/20/2013 22:07   Final   Colony Count >=100,000 COLONIES/ML   Final   Culture ESCHERICHIA COLI   Final   Report Status PENDING   Incomplete  MRSA PCR SCREENING     Status: None   Collection Time    01/21/13 12:25 AM      Result Value Range Status   MRSA by PCR NEGATIVE  NEGATIVE Final   Comment:            The GeneXpert MRSA Assay (FDA     approved for NASAL specimens  only), is one component of a     comprehensive MRSA colonization     surveillance program. It is not     intended to diagnose MRSA     infection nor to guide or     monitor treatment for     MRSA infections.     Studies: No results found.  Scheduled Meds: . feeding supplement  1 Container Oral BID BM  . levofloxacin (LEVAQUIN) IV  750 mg Intravenous Q24H  . pantoprazole  40 mg Oral Daily   Continuous Infusions: . sodium chloride 125 mL/hr at 01/21/13 1307    Active Problems:   GERD (gastroesophageal reflux disease)   Pyelonephritis   SIRS (systemic inflammatory response syndrome)   Hyponatremia    Time spent: 35 minutes    Ivadell Gaul  [facility] Pager [phone]. If 7PM-7AM, please contact night-coverage at [email or web address], password [facility] 01/21/2013, 1:48 PM  LOS: 1 day

## 2013-01-22 LAB — IRON AND TIBC

## 2013-01-22 LAB — URINE CULTURE: Colony Count: >=100000

## 2013-01-22 LAB — CBC
MCH: 26.2 pg (ref 26.0–34.0)
MCHC: 32.9 g/dL (ref 30.0–36.0)
Platelets: 98 10*3/uL — ABNORMAL LOW (ref 150–400)
RBC: 3.78 MIL/uL — ABNORMAL LOW (ref 3.87–5.11)

## 2013-01-22 LAB — VITAMIN B12: Vitamin B-12: 331 pg/mL (ref 211–911)

## 2013-01-22 LAB — RETICULOCYTES
RBC.: 3.78 MIL/uL — ABNORMAL LOW (ref 3.87–5.11)
Retic Ct Pct: 0.5 % (ref 0.4–3.1)

## 2013-01-22 LAB — BASIC METABOLIC PANEL
Calcium: 8.3 mg/dL — ABNORMAL LOW (ref 8.4–10.5)
GFR calc non Af Amer: >90 mL/min (ref >90)
Sodium: 133 mEq/L — ABNORMAL LOW (ref 135–145)

## 2013-01-22 MED ORDER — OXYCODONE HCL 5 MG PO TABS
5.0000 mg | ORAL_TABLET | Freq: Four times a day (QID) | ORAL | Status: DC | PRN
  Administered 2013-01-22 – 2013-01-25 (×8): 5 mg via ORAL
  Filled 2013-01-22 (×9): qty 1

## 2013-01-22 NOTE — Progress Notes (Signed)
[facility] PROGRESS NOTE  Jill Peters MVH:[id number] DOB: Nov 25, 1980 DOA: 01/20/2013 PCP: Kristian Covey, MD  Assessment/Plan: 1. Pyelonephritis: Continue with IV  Levaquin day 2, IV fluids. UA growing E coli sensitive to levaquin. WBC trending down. 2. Nausea, Vomiting: Probably related to pyelonephritis. Continue with phenergan, Zofran. Change protonix to BID. Still vomiting but improved.  3. Anemia: iron deficiency. Need iron tablet at discharge.   Code Status: full Family Communication: Care discussed with patient..  Disposition Plan: home when stable   Consultants:  none  Procedures:  none  Antibiotics:  Levaquin 2-20  HPI/Subjective: She had better night, was able to sleep. She is less nauseous and vomiting less.  Pain improved, decrease to 5.   Objective: Filed Vitals:   01/21/13 1942 01/21/13 2057 01/21/13 2209 01/22/13 0646  BP:   138/78 141/77  Pulse:   93 78  Temp: 102.7 F (39.3 C) 99.5 F (37.5 C) 98.9 F (37.2 C) 97.9 F (36.6 C)  TempSrc: Oral Oral Oral Oral  Resp:   20 16  Height:      Weight:      SpO2:   100% 99%    Intake/Output Summary (Last 24 hours) at 01/22/13 1203 Last data filed at 01/22/13 0705  Gross per 24 hour  Intake 3796.25 ml  Output   2800 ml  Net 996.25 ml   Filed Weights   01/20/13 2032  Weight: 103.7 kg (228 lb 9.9 oz)    Exam:   General:  No distress  Cardiovascular: S1, S 2 RRR  Respiratory: CTA  Abdomen: bs present, soft, nt  Musculoskeletal: no edema   Data Reviewed: Basic Metabolic Panel:  Recent Labs Lab 01/20/13 1545 01/21/13 0458 01/22/13 0435  NA 132* 132* 133*  K 3.5 3.7 3.5  CL 96 100 101  CO2 24 22 21   GLUCOSE 134* 124* 78  BUN 9 6 6   CREATININE 0.71 0.70 0.61  CALCIUM 9.0 8.1* 8.3*   Liver Function Tests:  Recent Labs Lab 01/20/13 1545  AST 13  ALT 13  ALKPHOS 87  BILITOT 0.6  PROT 7.9  ALBUMIN 3.1*    Recent Labs Lab 01/20/13 1545  LIPASE 11   No  results found for this basename: AMMONIA,  in the last 168 hours CBC:  Recent Labs Lab 01/20/13 1545 01/21/13 0458 01/22/13 0435  WBC 12.3* 7.9 7.0  NEUTROABS 10.4*  --   --   HGB 12.4 10.9* 9.9*  HCT 38.5 33.2* 30.1*  MCV 80.0 79.6 79.6  PLT 132* 87* 98*   Cardiac Enzymes: No results found for this basename: CKTOTAL, CKMB, CKMBINDEX, TROPONINI,  in the last 168 hours BNP (last 3 results) No results found for this basename: PROBNP,  in the last 8760 hours CBG: No results found for this basename: GLUCAP,  in the last 168 hours  Recent Results (from the past 240 hour(s))  URINE CULTURE     Status: None   Collection Time    01/20/13  4:44 PM      Result Value Range Status   Specimen Description URINE, CLEAN CATCH   Final   Special Requests NONE   Final   Culture  Setup Time 01/20/2013 22:07   Final   Colony Count >=100,000 COLONIES/ML   Final   Culture ESCHERICHIA COLI   Final   Report Status 01/22/2013 FINAL   Final   Organism ID, Bacteria ESCHERICHIA COLI   Final  CULTURE, BLOOD (ROUTINE X 2)  Status: None   Collection Time    01/20/13  9:15 PM      Result Value Range Status   Specimen Description BLOOD RIGHT ARM   Final   Special Requests BOTTLES DRAWN AEROBIC AND ANAEROBIC 5CC   Final   Culture  Setup Time 01/21/2013 02:43   Final   Culture     Final   Value:        BLOOD CULTURE RECEIVED NO GROWTH TO DATE CULTURE WILL BE HELD FOR 5 DAYS BEFORE ISSUING A FINAL NEGATIVE REPORT   Report Status PENDING   Incomplete  CULTURE, BLOOD (ROUTINE X 2)     Status: None   Collection Time    01/20/13  9:17 PM      Result Value Range Status   Specimen Description BLOOD RIGHT HAND   Final   Special Requests BOTTLES DRAWN AEROBIC ONLY 3CC   Final   Culture  Setup Time 01/21/2013 02:44   Final   Culture     Final   Value:        BLOOD CULTURE RECEIVED NO GROWTH TO DATE CULTURE WILL BE HELD FOR 5 DAYS BEFORE ISSUING A FINAL NEGATIVE REPORT   Report Status PENDING   Incomplete   MRSA PCR SCREENING     Status: None   Collection Time    01/21/13 12:25 AM      Result Value Range Status   MRSA by PCR NEGATIVE  NEGATIVE Final   Comment:            The GeneXpert MRSA Assay (FDA     approved for NASAL specimens     only), is one component of a     comprehensive MRSA colonization     surveillance program. It is not     intended to diagnose MRSA     infection nor to guide or     monitor treatment for     MRSA infections.     Studies: No results found.  Scheduled Meds: . feeding supplement  1 Container Oral BID BM  . levofloxacin (LEVAQUIN) IV  750 mg Intravenous Q24H  . pantoprazole  40 mg Oral BID WC   Continuous Infusions: . sodium chloride 125 mL/hr at 01/22/13 0345    Active Problems:   GERD (gastroesophageal reflux disease)   Pyelonephritis   SIRS (systemic inflammatory response syndrome)   Hyponatremia    Time spent: 35 minutes    Eris Breck  [facility] Pager [phone]. If 7PM-7AM, please contact night-coverage at [email or web address], password [facility] 01/22/2013, 12:03 PM  LOS: 2 days

## 2013-01-23 MED ORDER — LEVOFLOXACIN 750 MG PO TABS
750.0000 mg | ORAL_TABLET | Freq: Every day | ORAL | Status: DC
  Administered 2013-01-24 – 2013-01-25 (×2): 750 mg via ORAL
  Filled 2013-01-23 (×2): qty 1

## 2013-01-23 NOTE — Progress Notes (Signed)
[facility] PROGRESS NOTE  Jill Peters ZOX:[id number] DOB: 07-12-1981 DOA: 01/20/2013 PCP: Kristian Covey, MD  Assessment/Plan: 1. Pyelonephritis: Continue with IV  Levaquin day 3, IV fluids. UA growing E coli sensitive to levaquin. WBC trending down. 2. Nausea, Vomiting: Probably related to pyelonephritis. Continue with phenergan, Zofran. Change protonix to BID. Improving. Would like to try solid food. Will start bland diet.  3. Anemia: iron deficiency. Need iron tablet at discharge.   Code Status: full Family Communication: Care discussed with patient..  Disposition Plan: home when stable, in 24 to 48 hours if tolerates diet.    Consultants:  none  Procedures:  none  Antibiotics:  Levaquin 2-20  HPI/Subjective: She had better night, was able to sleep. Nausea and pain improved. Willing to try bland diet.  Objective: Filed Vitals:   01/22/13 1447 01/22/13 1544 01/22/13 2232 01/23/13 0548  BP:  113/54 139/85 135/81  Pulse:  71 98 81  Temp: 99.1 F (37.3 C) 98.6 F (37 C) 99.8 F (37.7 C) 98.9 F (37.2 C)  TempSrc: Oral Oral Oral Oral  Resp:  17 18 18   Height:      Weight:      SpO2:  100% 100% 99%    Intake/Output Summary (Last 24 hours) at 01/23/13 1300 Last data filed at 01/23/13 1043  Gross per 24 hour  Intake    240 ml  Output   2627 ml  Net  -2387 ml   Filed Weights   01/20/13 2032  Weight: 103.7 kg (228 lb 9.9 oz)    Exam:   General:  No distress  Cardiovascular: S1, S 2 RRR  Respiratory: CTA  Abdomen: bs present, soft, nt  Musculoskeletal: no edema   Data Reviewed: Basic Metabolic Panel:  Recent Labs Lab 01/20/13 1545 01/21/13 0458 01/22/13 0435  NA 132* 132* 133*  K 3.5 3.7 3.5  CL 96 100 101  CO2 24 22 21   GLUCOSE 134* 124* 78  BUN 9 6 6   CREATININE 0.71 0.70 0.61  CALCIUM 9.0 8.1* 8.3*   Liver Function Tests:  Recent Labs Lab 01/20/13 1545  AST 13  ALT 13  ALKPHOS 87  BILITOT 0.6  PROT 7.9  ALBUMIN  3.1*    Recent Labs Lab 01/20/13 1545  LIPASE 11   No results found for this basename: AMMONIA,  in the last 168 hours CBC:  Recent Labs Lab 01/20/13 1545 01/21/13 0458 01/22/13 0435  WBC 12.3* 7.9 7.0  NEUTROABS 10.4*  --   --   HGB 12.4 10.9* 9.9*  HCT 38.5 33.2* 30.1*  MCV 80.0 79.6 79.6  PLT 132* 87* 98*   Cardiac Enzymes: No results found for this basename: CKTOTAL, CKMB, CKMBINDEX, TROPONINI,  in the last 168 hours BNP (last 3 results) No results found for this basename: PROBNP,  in the last 8760 hours CBG: No results found for this basename: GLUCAP,  in the last 168 hours  Recent Results (from the past 240 hour(s))  URINE CULTURE     Status: None   Collection Time    01/20/13  4:44 PM      Result Value Range Status   Specimen Description URINE, CLEAN CATCH   Final   Special Requests NONE   Final   Culture  Setup Time 01/20/2013 22:07   Final   Colony Count >=100,000 COLONIES/ML   Final   Culture ESCHERICHIA COLI   Final   Report Status 01/22/2013 FINAL   Final   Organism ID, Bacteria  ESCHERICHIA COLI   Final  CULTURE, BLOOD (ROUTINE X 2)     Status: None   Collection Time    01/20/13  9:15 PM      Result Value Range Status   Specimen Description BLOOD RIGHT ARM   Final   Special Requests BOTTLES DRAWN AEROBIC AND ANAEROBIC 5CC   Final   Culture  Setup Time 01/21/2013 02:43   Final   Culture     Final   Value:        BLOOD CULTURE RECEIVED NO GROWTH TO DATE CULTURE WILL BE HELD FOR 5 DAYS BEFORE ISSUING A FINAL NEGATIVE REPORT   Report Status PENDING   Incomplete  CULTURE, BLOOD (ROUTINE X 2)     Status: None   Collection Time    01/20/13  9:17 PM      Result Value Range Status   Specimen Description BLOOD RIGHT HAND   Final   Special Requests BOTTLES DRAWN AEROBIC ONLY 3CC   Final   Culture  Setup Time 01/21/2013 02:44   Final   Culture     Final   Value:        BLOOD CULTURE RECEIVED NO GROWTH TO DATE CULTURE WILL BE HELD FOR 5 DAYS BEFORE ISSUING A  FINAL NEGATIVE REPORT   Report Status PENDING   Incomplete  MRSA PCR SCREENING     Status: None   Collection Time    01/21/13 12:25 AM      Result Value Range Status   MRSA by PCR NEGATIVE  NEGATIVE Final   Comment:            The GeneXpert MRSA Assay (FDA     approved for NASAL specimens     only), is one component of a     comprehensive MRSA colonization     surveillance program. It is not     intended to diagnose MRSA     infection nor to guide or     monitor treatment for     MRSA infections.     Studies: No results found.  Scheduled Meds: . feeding supplement  1 Container Oral BID BM  . levofloxacin (LEVAQUIN) IV  750 mg Intravenous Q24H  . pantoprazole  40 mg Oral BID WC   Continuous Infusions: . sodium chloride 100 mL/hr at 01/23/13 1125    Active Problems:   GERD (gastroesophageal reflux disease)   Pyelonephritis   SIRS (systemic inflammatory response syndrome)   Hyponatremia    Time spent: 35 minutes    Emerson Schreifels  [facility] Pager [phone]. If 7PM-7AM, please contact night-coverage at [email or web address], password [employer] 01/23/2013, 1:00 PM  LOS: 3 days

## 2013-01-24 LAB — CBC
Platelets: 91 10*3/uL — ABNORMAL LOW (ref 150–400)
RBC: 3.46 MIL/uL — ABNORMAL LOW (ref 3.87–5.11)
WBC: 5 10*3/uL (ref 4.0–10.5)

## 2013-01-24 MED ORDER — VITAMIN B-12 100 MCG PO TABS
100.0000 ug | ORAL_TABLET | Freq: Every day | ORAL | Status: DC
  Administered 2013-01-24 – 2013-01-25 (×2): 100 ug via ORAL
  Filled 2013-01-24 (×3): qty 1

## 2013-01-24 MED ORDER — PANTOPRAZOLE SODIUM 40 MG PO TBEC
40.0000 mg | DELAYED_RELEASE_TABLET | Freq: Every day | ORAL | Status: DC
Start: 2013-01-25 — End: 2013-01-25
  Administered 2013-01-25: 40 mg via ORAL
  Filled 2013-01-24: qty 1

## 2013-01-24 NOTE — Progress Notes (Signed)
[facility] PROGRESS NOTE  Jill Peters WUJ:[id number] DOB: [birth year] DOA: 01/20/2013 PCP: Kristian Covey, MD  Assessment/Plan: 1. Pyelonephritis: Patient recieved  IV  Levaquin for 3 days.  IV fluids. UA grew E coli sensitive to levaquin. WBC trending down. Will observed on oral antibiotics to make sure patient is able to toleratres oral. Had mild fever.  2. Nausea, Vomiting: Probably related to pyelonephritis. Continue with phenergan, Zofran. Change protonix to daily. Improving. No vomiting.stillwithnausea. 3. Anemia: iron deficiency. Need iron tablet at discharge. Will give B12 supplement. 4. Thrombocytopenia:Stable.Probably related to infection.  Code Status: full Family Communication: Care discussed with patient..  Disposition Plan: home when stable, in 24 to 48 hours if tolerates diet.    Consultants:  none  Procedures:  none  Antibiotics:  Levaquin 2-20  HPI/Subjective: Feeling ok. Had fever last night. No vomiting. Still with nausea but improved.  Objective: Filed Vitals:   01/23/13 0548 01/23/13 1326 01/23/13 2144 01/24/13 0333  BP: 135/81 136/92 153/99 136/97  Pulse: 81 83 97 85  Temp: 98.9 F (37.2 C) 99 F (37.2 C) 100 F (37.8 C) 97.8 F (36.6 C)  TempSrc: Oral Oral Oral Oral  Resp: 18 18 18 20   Height:    5\' 4"  (1.626 m)  Weight:    105.9 kg (233 lb 7.5 oz)  SpO2: 99% 100% 100% 100%    Intake/Output Summary (Last 24 hours) at 01/24/13 0850 Last data filed at 01/24/13 0600  Gross per 24 hour  Intake   2715 ml  Output   1950 ml  Net    765 ml   Filed Weights   01/20/13 2032 01/24/13 0333  Weight: 103.7 kg (228 lb 9.9 oz) 105.9 kg (233 lb 7.5 oz)    Exam:   General:  No distress  Cardiovascular: S1, S 2 RRR  Respiratory: CTA  Abdomen: bs present, soft, nt  Musculoskeletal: no edema   Data Reviewed: Basic Metabolic Panel:  Recent Labs Lab 01/20/13 1545 01/21/13 0458 01/22/13 0435  NA 132* 132* 133*  K 3.5 3.7  3.5  CL 96 100 101  CO2 24 22 21   GLUCOSE 134* 124* 78  BUN 9 6 6   CREATININE 0.71 0.70 0.61  CALCIUM 9.0 8.1* 8.3*   Liver Function Tests:  Recent Labs Lab 01/20/13 1545  AST 13  ALT 13  ALKPHOS 87  BILITOT 0.6  PROT 7.9  ALBUMIN 3.1*    Recent Labs Lab 01/20/13 1545  LIPASE 11   No results found for this basename: AMMONIA,  in the last 168 hours CBC:  Recent Labs Lab 01/20/13 1545 01/21/13 0458 01/22/13 0435 01/24/13 0558  WBC 12.3* 7.9 7.0 5.0  NEUTROABS 10.4*  --   --   --   HGB 12.4 10.9* 9.9* 8.8*  HCT 38.5 33.2* 30.1* 27.1*  MCV 80.0 79.6 79.6 78.3  PLT 132* 87* 98* 91*   Cardiac Enzymes: No results found for this basename: CKTOTAL, CKMB, CKMBINDEX, TROPONINI,  in the last 168 hours BNP (last 3 results) No results found for this basename: PROBNP,  in the last 8760 hours CBG: No results found for this basename: GLUCAP,  in the last 168 hours  Recent Results (from the past 240 hour(s))  URINE CULTURE     Status: None   Collection Time    01/20/13  4:44 PM      Result Value Range Status   Specimen Description URINE, CLEAN CATCH   Final   Special Requests NONE  Final   Culture  Setup Time 01/20/2013 22:07   Final   Colony Count >=100,000 COLONIES/ML   Final   Culture ESCHERICHIA COLI   Final   Report Status 01/22/2013 FINAL   Final   Organism ID, Bacteria ESCHERICHIA COLI   Final  CULTURE, BLOOD (ROUTINE X 2)     Status: None   Collection Time    01/20/13  9:15 PM      Result Value Range Status   Specimen Description BLOOD RIGHT ARM   Final   Special Requests BOTTLES DRAWN AEROBIC AND ANAEROBIC 5CC   Final   Culture  Setup Time 01/21/2013 02:43   Final   Culture     Final   Value:        BLOOD CULTURE RECEIVED NO GROWTH TO DATE CULTURE WILL BE HELD FOR 5 DAYS BEFORE ISSUING A FINAL NEGATIVE REPORT   Report Status PENDING   Incomplete  CULTURE, BLOOD (ROUTINE X 2)     Status: None   Collection Time    01/20/13  9:17 PM      Result Value  Range Status   Specimen Description BLOOD RIGHT HAND   Final   Special Requests BOTTLES DRAWN AEROBIC ONLY 3CC   Final   Culture  Setup Time 01/21/2013 02:44   Final   Culture     Final   Value:        BLOOD CULTURE RECEIVED NO GROWTH TO DATE CULTURE WILL BE HELD FOR 5 DAYS BEFORE ISSUING A FINAL NEGATIVE REPORT   Report Status PENDING   Incomplete  MRSA PCR SCREENING     Status: None   Collection Time    01/21/13 12:25 AM      Result Value Range Status   MRSA by PCR NEGATIVE  NEGATIVE Final   Comment:            The GeneXpert MRSA Assay (FDA     approved for NASAL specimens     only), is one component of a     comprehensive MRSA colonization     surveillance program. It is not     intended to diagnose MRSA     infection nor to guide or     monitor treatment for     MRSA infections.     Studies: No results found.  Scheduled Meds: . feeding supplement  1 Container Oral BID BM  . levofloxacin  750 mg Oral Daily  . pantoprazole  40 mg Oral BID WC  . vitamin B-12  100 mcg Oral Daily   Continuous Infusions: . sodium chloride 100 mL/hr at 01/23/13 2300    Active Problems:   GERD (gastroesophageal reflux disease)   Pyelonephritis   SIRS (systemic inflammatory response syndrome)   Hyponatremia    Time spent: 35 minutes    [name]  [facility] Pager [phone]. If 7PM-7AM, please contact night-coverage at [email or web address], password [facility] 01/24/2013, 8:50 AM  LOS: 4 days

## 2013-01-25 ENCOUNTER — Inpatient Hospital Stay (HOSPITAL_COMMUNITY): Payer: BC Managed Care – PPO

## 2013-01-25 MED ORDER — ONDANSETRON HCL 4 MG PO TABS
4.0000 mg | ORAL_TABLET | Freq: Four times a day (QID) | ORAL | Status: DC | PRN
  Administered 2013-01-25: 4 mg via ORAL
  Filled 2013-01-25: qty 1

## 2013-01-25 MED ORDER — LEVOFLOXACIN 750 MG PO TABS: 750.0000 mg | ORAL_TABLET | Freq: Every day | ORAL | Status: DC

## 2013-01-25 MED ORDER — ONDANSETRON HCL 4 MG PO TABS: 4.0000 mg | ORAL_TABLET | Freq: Four times a day (QID) | ORAL | Status: DC | PRN

## 2013-01-25 NOTE — Progress Notes (Addendum)
Pt is to be discharged home today. Pt is in NAD, IV is out, all paperwork has been reviewed/discussed with patient, and there are no questions/concerns at this time. Assessment is unchanged from this morning. Pt is to be accompanied downstairs by family. Pt refused assistance by staff and requested to ambulate downstairs with family.

## 2013-01-25 NOTE — Progress Notes (Signed)
When in pt's room noticed the bed is in a high position. Educated pt on importance of safety and need to keep bed in a low position. Pt refused to lower bed because her bed at home sits high and she has a hard time getting in and out of the bed when it is in the low position. Told pt to make sure to call for any assistance and will continue to monitor.

## 2013-01-25 NOTE — Progress Notes (Signed)
Pt ambulating in hall frequently. Since 0700 pt has ambulated hall x4

## 2013-01-25 NOTE — Discharge Summary (Signed)
Physician Discharge Summary  Jill Peters:[id number] DOB: 05/30/1981 DOA: 01/20/2013  PCP: Kristian Covey, MD  Admit date: 01/20/2013 Discharge date: 01/25/2013  Time spent: 30 minutes  Recommendations for Outpatient Follow-up:  1. Needs CBC to follow up hb and platelet level. 2. Need iron tablet prescription.  3. Repeat BP.   Discharge Diagnoses:    Pyelonephritis   SIRS (systemic inflammatory response syndrome)   Hyponatremia   GERD (gastroesophageal reflux disease)    Discharge Condition: stable.   Diet recommendation: general  Filed Weights   01/20/13 2032 01/24/13 0333  Weight: 103.7 kg (228 lb 9.9 oz) 105.9 kg (233 lb 7.5 oz)    History of present illness:  Jill Peters is a 32 y.o. female with history of low back pain/sciatica who presents with above complaints. She states that 3 days ago she began having increased left flank pain and came to the ED, a CT scan of the abdomen and pelvis was done and was negative for any acute process also negative for stones, urinalysis at that time showed 7-10 WBCs and numerous RBCs and moderate leukocyte esterase-not cultures sent. She was treated for sciatica-discharged on oral narcotics. Since she was sent home she reports she developed nausea vomiting and fevers up to 103 and so went to see her PCP today and was asked to come back to the ED. Upon return to the ED urinalysis was again consistent with a UTI, she had fevers 102, initially at tachycardic and had a white cell count of 12.3. She was started on IV fluids and her heart rate improved to 90, hospitalist asked to admit patient for IV antibiotics. Patient also admits to hematuria.   Hospital Course:  1. Pyelonephritis: Patient recieved IV Levaquin for 3 days. IV fluids. UA grew E coli sensitive to levaquin. WBC trending down. Had mild fever. Renal US negative for abscess. She has been able to tolerates diet today is day 4 of antibiotics. I will prescribe 10 more days.  She was advised to follow up with PCP.  2. Nausea, Vomiting: Probably related to pyelonephritis. Continue with phenergan, Zofran. Change protonix to daily. Improving. No vomiting. Tolerating diet.  3. Anemia: iron deficiency. She will need to be started on iron tablet, when not more issues with nausea.  4. Thrombocytopenia:Stable.Probably related to infection. Need repeat CBC.  5.   Procedures: Renal US: No acute findings on this study limited by patient body habitus.    Consultations:  none  Discharge Exam: Filed Vitals:   01/24/13 1345 01/24/13 2239 01/25/13 0548 01/25/13 1305  BP: 147/93 134/90 152/98 168/103  Pulse: 85 98 75 80  Temp: 100.2 F (37.9 C) 99.3 F (37.4 C) 98.7 F (37.1 C) 98.7 F (37.1 C)  TempSrc: Oral Oral Oral Oral  Resp: 18 18 16 16   Height:      Weight:      SpO2: 100% 100% 98% 99%    General: no distress.  Cardiovascular: S 1, S 2 RRR Respiratory: CTA  Discharge Instructions  Discharge Orders   Future Orders Complete By Expires     Diet general  As directed     Increase activity slowly  As directed         Medication List    STOP taking these medications       ibuprofen 200 MG tablet  Commonly known as:  ADVIL,MOTRIN      TAKE these medications       cyclobenzaprine 10 MG tablet  Commonly known  as:  FLEXERIL  Take 1 tablet (10 mg total) by mouth 2 (two) times daily as needed for muscle spasms.     levofloxacin 750 MG tablet  Commonly known as:  LEVAQUIN  Take 1 tablet (750 mg total) by mouth daily.     ondansetron 4 MG tablet  Commonly known as:  ZOFRAN  Take 1 tablet (4 mg total) by mouth every 6 (six) hours as needed.     oxyCODONE-acetaminophen 5-325 MG per tablet  Commonly known as:  PERCOCET/ROXICET  Take 1 tablet by mouth every 4 (four) hours as needed for pain.          The results of significant diagnostics from this hospitalization (including imaging, microbiology, ancillary and laboratory) are listed below  for reference.    Significant Diagnostic Studies: Ct Abdomen Pelvis Wo Contrast  01/17/2013  *RADIOLOGY REPORT*  Clinical Data: Left flank pain radiating to the back.  CT ABDOMEN AND PELVIS WITHOUT CONTRAST  Technique:  Multidetector CT imaging of the abdomen and pelvis was performed following the standard protocol without intravenous contrast.  Comparison: None.  Findings: The lung bases are clear.  The kidneys appear symmetrical in size and shape.  No pyelocaliectasis or ureterectasis.  No renal, ureteral, or bladder stones.  The bladder is decompressed.  The unenhanced appearance of the liver, spleen, pancreas, gallbladder, adrenal glands, abdominal aorta, and retroperitoneal lymph nodes is unremarkable.  The stomach, small bowel, and colon are not abnormally distended.  No free air or free fluid in the abdomen.  Pelvis:  Uterus and adnexal structures are not enlarged.  No free or loculated pelvic fluid collections.  No significant pelvic lymphadenopathy.  Diverticula in the sigmoid colon without diverticulitis.  The appendix is normal.  Normal alignment of the lumbar vertebrae.  IMPRESSION: No renal or ureteral stone or obstruction.  No acute process identified in the abdomen or pelvis.   Original Report Authenticated By: Burman Nieves, M.D.    US Renal  01/25/2013  *RADIOLOGY REPORT*  Clinical Data: Fever and UTI  RENAL / URINARY TRACT ULTRASOUND  Technique:  Complete ultrasound exam of the kidneys and urinary bladder was performed.  Comparison: CT scan from 01/17/2013  Findings:  The right kidney measures 13.0 cm in long axis.  The left kidney measures 12.5 cm.  Kidneys are difficult to visualize secondary to technical factors related to the patient's large body habitus.  No overt hydronephrosis in either kidney.  Midline imaging in the anatomic pelvis reveals a nondistended urinary bladder.  Impression:  No acute findings on this study limited by patient body habitus.   Original Report Authenticated  By: Kennith Center, M.D.     Microbiology: Recent Results (from the past 240 hour(s))  URINE CULTURE     Status: None   Collection Time    01/20/13  4:44 PM      Result Value Range Status   Specimen Description URINE, CLEAN CATCH   Final   Special Requests NONE   Final   Culture  Setup Time 01/20/2013 22:07   Final   Colony Count >=100,000 COLONIES/ML   Final   Culture ESCHERICHIA COLI   Final   Report Status 01/22/2013 FINAL   Final   Organism ID, Bacteria ESCHERICHIA COLI   Final  CULTURE, BLOOD (ROUTINE X 2)     Status: None   Collection Time    01/20/13  9:15 PM      Result Value Range Status   Specimen Description BLOOD RIGHT ARM  Final   Special Requests BOTTLES DRAWN AEROBIC AND ANAEROBIC 5CC   Final   Culture  Setup Time 01/21/2013 02:43   Final   Culture     Final   Value:        BLOOD CULTURE RECEIVED NO GROWTH TO DATE CULTURE WILL BE HELD FOR 5 DAYS BEFORE ISSUING A FINAL NEGATIVE REPORT   Report Status PENDING   Incomplete  CULTURE, BLOOD (ROUTINE X 2)     Status: None   Collection Time    01/20/13  9:17 PM      Result Value Range Status   Specimen Description BLOOD RIGHT HAND   Final   Special Requests BOTTLES DRAWN AEROBIC ONLY 3CC   Final   Culture  Setup Time 01/21/2013 02:44   Final   Culture     Final   Value:        BLOOD CULTURE RECEIVED NO GROWTH TO DATE CULTURE WILL BE HELD FOR 5 DAYS BEFORE ISSUING A FINAL NEGATIVE REPORT   Report Status PENDING   Incomplete  MRSA PCR SCREENING     Status: None   Collection Time    01/21/13 12:25 AM      Result Value Range Status   MRSA by PCR NEGATIVE  NEGATIVE Final   Comment:            The GeneXpert MRSA Assay (FDA     approved for NASAL specimens     only), is one component of a     comprehensive MRSA colonization     surveillance program. It is not     intended to diagnose MRSA     infection nor to guide or     monitor treatment for     MRSA infections.     Labs: Basic Metabolic Panel:  Recent  Labs Lab 01/20/13 1545 01/21/13 0458 01/22/13 0435  NA 132* 132* 133*  K 3.5 3.7 3.5  CL 96 100 101  CO2 24 22 21   GLUCOSE 134* 124* 78  BUN 9 6 6   CREATININE 0.71 0.70 0.61  CALCIUM 9.0 8.1* 8.3*   Liver Function Tests:  Recent Labs Lab 01/20/13 1545  AST 13  ALT 13  ALKPHOS 87  BILITOT 0.6  PROT 7.9  ALBUMIN 3.1*    Recent Labs Lab 01/20/13 1545  LIPASE 11   No results found for this basename: AMMONIA,  in the last 168 hours CBC:  Recent Labs Lab 01/20/13 1545 01/21/13 0458 01/22/13 0435 01/24/13 0558  WBC 12.3* 7.9 7.0 5.0  NEUTROABS 10.4*  --   --   --   HGB 12.4 10.9* 9.9* 8.8*  HCT 38.5 33.2* 30.1* 27.1*  MCV 80.0 79.6 79.6 78.3  PLT 132* 87* 98* 91*   Cardiac Enzymes: No results found for this basename: CKTOTAL, CKMB, CKMBINDEX, TROPONINI,  in the last 168 hours BNP: BNP (last 3 results) No results found for this basename: PROBNP,  in the last 8760 hours CBG: No results found for this basename: GLUCAP,  in the last 168 hours     Signed:  Alichia Alridge  [facility] 01/25/2013, 1:28 PM

## 2013-01-25 NOTE — Care Management (Signed)
Cm spoke with patient concerning discharge planning. Pt independent, ambulating in hallway. Pt states tolerated breakfast, awaiting lunch. Pt states plan is to discharge home with spouse. No needs stated.PCP: Kristian Covey, MD.    Leonie Green [phone]

## 2013-01-27 LAB — CULTURE, BLOOD (ROUTINE X 2): Culture: NO GROWTH

## 2013-02-10 ENCOUNTER — Encounter: Payer: Self-pay | Admitting: Family Medicine

## 2013-02-10 ENCOUNTER — Ambulatory Visit (INDEPENDENT_AMBULATORY_CARE_PROVIDER_SITE_OTHER): Payer: BC Managed Care – PPO | Admitting: Family Medicine

## 2013-02-10 VITALS — BP 150/94 | Temp 99.2°F | Wt 194.0 lb

## 2013-02-10 DIAGNOSIS — R300 Dysuria: Secondary | ICD-10-CM

## 2013-02-10 DIAGNOSIS — R109 Unspecified abdominal pain: Secondary | ICD-10-CM

## 2013-02-10 DIAGNOSIS — D696 Thrombocytopenia, unspecified: Secondary | ICD-10-CM

## 2013-02-10 DIAGNOSIS — D649 Anemia, unspecified: Secondary | ICD-10-CM

## 2013-02-10 LAB — CBC WITH DIFFERENTIAL/PLATELET
Basophils Absolute: 0 10*3/uL (ref 0.0–0.1)
Basophils Relative: 0.3 % (ref 0.0–3.0)
Eosinophils Relative: 1 % (ref 0.0–5.0)
HCT: 41.9 % (ref 36.0–46.0)
Hemoglobin: 13.6 g/dL (ref 12.0–15.0)
Lymphs Abs: 4.1 10*3/uL — ABNORMAL HIGH (ref 0.7–4.0)
Monocytes Relative: 4.7 % (ref 3.0–12.0)
Neutro Abs: 4.9 10*3/uL (ref 1.4–7.7)
RDW: 20 % — ABNORMAL HIGH (ref 11.5–14.6)

## 2013-02-10 LAB — POCT URINALYSIS DIPSTICK
Ketones, UA: NEGATIVE
pH, UA: 6

## 2013-02-10 MED ORDER — ONDANSETRON HCL 8 MG PO TABS: 8.0000 mg | ORAL_TABLET | Freq: Three times a day (TID) | ORAL | Status: DC | PRN

## 2013-02-10 MED ORDER — CIPROFLOXACIN HCL 500 MG PO TABS: 500.0000 mg | ORAL_TABLET | Freq: Two times a day (BID) | ORAL | Status: DC

## 2013-02-10 NOTE — Patient Instructions (Addendum)
Follow up immediately for any vomiting or increasing fever. Drink lots of fluids.

## 2013-02-10 NOTE — Progress Notes (Signed)
  Subjective:    Patient ID: Jill Peters, female    DOB: [birth year], 32 y.o.   MRN: [id number]  HPI Patient seen with recurrent left flank pain and dysuria Refer to prior notes. She had presented here with fever flank pain and dysuria and suspected pyelonephritis back in March. She was admitted and grew out Escherichia coli from urine and blood cultures negative. Ultrasound kidneys did not reveal any evidence for nephric abscess. She eventually improved with IV Levaquin and fluids. She was discharged home and completed full course of oral antibiotics.  Doing well until 2 days ago she developed recurrent left flank pain possible low-grade fever, malaise. She had some mild nausea but no vomiting. Recent lab work significant for low platelet count 90,000 range and hemoglobin 8 range. Has history of frequent heavy menses She did not yet start on iron replacement  Past Medical History  Diagnosis Date  . Hypertension   . Hidradenitis suppurativa    Past Surgical History  Procedure Laterality Date  . Cystectomy  2005     removed from tail bone    reports that she has been smoking Cigarettes.  She has a 5 pack-year smoking history. She has never used smokeless tobacco. She reports that she does not drink alcohol or use illicit drugs. family history includes Arthritis in her maternal grandmother and mother; Hypertension in her father and maternal grandmother; and Leukemia in her mother. Allergies  Allergen Reactions  . Penicillins Anaphylaxis  . Naproxen Nausea Only      Review of Systems  Constitutional: Positive for fever, chills and fatigue.  Respiratory: Negative for cough and shortness of breath.   Gastrointestinal: Positive for nausea. Negative for vomiting, abdominal pain and diarrhea.  Genitourinary: Positive for dysuria.  Neurological: Negative for dizziness.       Objective:   Physical Exam  Constitutional: She appears well-developed and well-nourished. No distress.   Cardiovascular: Normal rate and regular rhythm.   Pulmonary/Chest: Effort normal and breath sounds normal. No respiratory distress. She has no wheezes. She has no rales.  Abdominal: Soft. Bowel sounds are normal. She exhibits no distension and no mass. There is no tenderness. There is no rebound and no guarding.  Musculoskeletal:  Patient has some mild left flank tenderness  Skin: No rash noted.          Assessment & Plan:  Recurrent dysuria and left flank pain in patient with recent pyelonephritis. At this point, she does not have any hypotension or any vomiting but has had some nausea. Refill Zofran for as needed use. Urine culture sent. Start Cipro 500 mg twice daily pending culture results. Followup immediately for any increased fever vomiting or worsening pain. Recheck CBC with recent abnormalities of hemoglobin and platelets as above

## 2013-02-11 NOTE — Progress Notes (Signed)
Quick Note:  Pt informed ______ 

## 2013-02-12 LAB — URINE CULTURE

## 2013-03-24 ENCOUNTER — Emergency Department (HOSPITAL_COMMUNITY)
Admission: EM | Admit: 2013-03-24 | Discharge: 2013-03-24 | Disposition: A | Discharge status: Home / Self Care | Payer: BC Managed Care – PPO | Attending: Emergency Medicine | Admitting: Emergency Medicine

## 2013-03-24 ENCOUNTER — Encounter (HOSPITAL_COMMUNITY): Payer: Self-pay | Admitting: *Deleted

## 2013-03-24 ENCOUNTER — Emergency Department (HOSPITAL_COMMUNITY): Payer: BC Managed Care – PPO

## 2013-03-24 DIAGNOSIS — I10 Essential (primary) hypertension: Secondary | ICD-10-CM | POA: Insufficient documentation

## 2013-03-24 DIAGNOSIS — Z8739 Personal history of other diseases of the musculoskeletal system and connective tissue: Secondary | ICD-10-CM | POA: Insufficient documentation

## 2013-03-24 DIAGNOSIS — N72 Inflammatory disease of cervix uteri: Secondary | ICD-10-CM | POA: Insufficient documentation

## 2013-03-24 DIAGNOSIS — M545 Low back pain, unspecified: Secondary | ICD-10-CM | POA: Insufficient documentation

## 2013-03-24 DIAGNOSIS — Z3202 Encounter for pregnancy test, result negative: Secondary | ICD-10-CM | POA: Insufficient documentation

## 2013-03-24 DIAGNOSIS — F17200 Nicotine dependence, unspecified, uncomplicated: Secondary | ICD-10-CM | POA: Insufficient documentation

## 2013-03-24 DIAGNOSIS — R3915 Urgency of urination: Secondary | ICD-10-CM | POA: Insufficient documentation

## 2013-03-24 DIAGNOSIS — N898 Other specified noninflammatory disorders of vagina: Secondary | ICD-10-CM | POA: Insufficient documentation

## 2013-03-24 DIAGNOSIS — R112 Nausea with vomiting, unspecified: Secondary | ICD-10-CM | POA: Insufficient documentation

## 2013-03-24 DIAGNOSIS — Z8719 Personal history of other diseases of the digestive system: Secondary | ICD-10-CM | POA: Insufficient documentation

## 2013-03-24 DIAGNOSIS — R350 Frequency of micturition: Secondary | ICD-10-CM | POA: Insufficient documentation

## 2013-03-24 DIAGNOSIS — R300 Dysuria: Secondary | ICD-10-CM | POA: Insufficient documentation

## 2013-03-24 DIAGNOSIS — Z880 Allergy status to penicillin: Secondary | ICD-10-CM | POA: Insufficient documentation

## 2013-03-24 LAB — URINALYSIS, ROUTINE W REFLEX MICROSCOPIC
Bilirubin Urine: NEGATIVE
Glucose, UA: NEGATIVE mg/dL
Hgb urine dipstick: NEGATIVE
Ketones, ur: NEGATIVE mg/dL
pH: 6.5 (ref 5.0–8.0)

## 2013-03-24 LAB — GC/CHLAMYDIA PROBE AMP
CT Probe RNA: NEGATIVE
GC Probe RNA: NEGATIVE

## 2013-03-24 LAB — WET PREP, GENITAL
Clue Cells Wet Prep HPF POC: NONE SEEN
Trich, Wet Prep: NONE SEEN

## 2013-03-24 MED ORDER — AZITHROMYCIN 250 MG PO TABS
1000.0000 mg | ORAL_TABLET | Freq: Once | ORAL | Status: AC
  Administered 2013-03-24: 1000 mg via ORAL
  Filled 2013-03-24: qty 4

## 2013-03-24 MED ORDER — HYDROCODONE-ACETAMINOPHEN 5-325 MG PO TABS
1.0000 | ORAL_TABLET | Freq: Once | ORAL | Status: AC
  Administered 2013-03-24: 1 via ORAL
  Filled 2013-03-24: qty 1

## 2013-03-24 MED ORDER — ONDANSETRON HCL 4 MG PO TABS
4.0000 mg | ORAL_TABLET | Freq: Once | ORAL | Status: AC
  Administered 2013-03-24: 4 mg via ORAL
  Filled 2013-03-24: qty 1

## 2013-03-24 NOTE — ED Provider Notes (Signed)
Medical screening examination/treatment/procedure(s) were performed by non-physician practitioner and as supervising physician I was immediately available for consultation/collaboration.  Olivia Mackie, MD 03/24/13 [phone]

## 2013-03-24 NOTE — ED Notes (Signed)
PA at bedside.

## 2013-03-24 NOTE — ED Provider Notes (Signed)
History     CSN: [id number]  Arrival date & time 03/24/13  0504   First MD Initiated Contact with Patient 03/24/13 [phone]      Chief Complaint  Patient presents with  . Flank Pain  . Nausea    (Consider location/radiation/quality/duration/timing/severity/associated sxs/prior treatment) HPI Comments: 33 y.o female with PMHx of pyelonephritis (admitted 02/2013), sciatica, HTN presents today after gradually worsening left flank pain that began Monday night. Last night pain intensified, not relieved with ibuprofen and heating pads. Vomited last night and this morning on the way to the ED. Pt has a primary care doctor, but due to the hour came straight here. Admits frequency, urgency. Denies suprapubic pain, fever, chills, cramping, vaginal discharge/odor, bowel/bladder incontinence, night sweats, hx of cancer.   Patient is a 32 y.o. female presenting with flank pain.  Flank Pain Pertinent negatives include no chest pain, chills, diaphoresis, fever, headaches, nausea, neck pain, numbness, rash, vomiting or weakness.    Past Medical History  Diagnosis Date  . Hypertension   . Hidradenitis suppurativa     Past Surgical History  Procedure Laterality Date  . Cystectomy  2005     removed from tail bone    Family History  Problem Relation Age of Onset  . Arthritis Mother   . Leukemia Mother   . Hypertension Father   . Arthritis Maternal Grandmother   . Hypertension Maternal Grandmother     History  Substance Use Topics  . Smoking status: Current Every Day Smoker -- 0.50 packs/day for 10 years    Types: Cigarettes  . Smokeless tobacco: Never Used  . Alcohol Use: No    OB History   Grav Para Term Preterm Abortions TAB SAB Ect Mult Living                  Review of Systems  Constitutional: Negative for fever, chills and diaphoresis.  HENT: Negative for neck pain and neck stiffness.   Eyes: Negative for visual disturbance.  Respiratory: Negative for apnea, chest tightness  and shortness of breath.   Cardiovascular: Negative for chest pain and palpitations.  Gastrointestinal: Negative for nausea, vomiting, diarrhea and constipation.  Genitourinary: Positive for dysuria and flank pain. Negative for hematuria and pelvic pain.       Left flank pain  Musculoskeletal: Positive for back pain. Negative for gait problem.       Left sided  Skin: Negative for rash.  Neurological: Negative for dizziness, weakness, light-headedness, numbness and headaches.    Allergies  Penicillins and Naproxen  Home Medications   Current Outpatient Rx  Name  Route  Sig  Dispense  Refill  . ibuprofen (ADVIL,MOTRIN) 200 MG tablet   Oral   Take 200 mg by mouth every 6 (six) hours as needed for pain.           BP 147/87  Pulse 95  Temp(Src) 97.7 F (36.5 C) (Oral)  Resp 20  Wt 210 lb (95.255 kg)  BMI 36.03 kg/m2  SpO2 100%  LMP 03/08/2013  Physical Exam  Nursing note and vitals reviewed. Constitutional: She is oriented to person, place, and time. She appears well-developed and well-nourished. No distress.  HENT:  Head: Normocephalic and atraumatic.  Eyes: Conjunctivae and EOM are normal.  Neck: Normal range of motion. Neck supple.  No meningeal signs  Cardiovascular: Normal rate, regular rhythm and normal heart sounds.  Exam reveals no gallop and no friction rub.   No murmur heard. Pulmonary/Chest: Effort normal and breath sounds  normal. No respiratory distress. She has no wheezes. She has no rales. She exhibits no tenderness.  Abdominal: Soft. Bowel sounds are normal. She exhibits no distension. There is no tenderness. There is no rebound and no guarding.  Genitourinary: Vagina normal. No vaginal discharge found.  No external lesions. Mild cervical discharge and cervical discharge. No adnexal tenderness. Chaperone present  Musculoskeletal: Normal range of motion. She exhibits no edema and no tenderness.  Mild tenderness to palpation of the lumbar paraspinous  muscles Normal strength in upper and lower extremities bilaterally including dorsiflexion and plantar flexion, strong and equal grip strength  Neurological: She is alert and oriented to person, place, and time. No cranial nerve deficit.  Speech is clear and goal oriented, follows commands Sensation normal to light touch Moves extremities without ataxia, coordination intact Normal gait and balance  Skin: Skin is warm and dry. She is not diaphoretic. No erythema.  Psychiatric: She has a normal mood and affect.    ED Course  Procedures (including critical care time) Medications  azithromycin (ZITHROMAX) tablet 1,000 mg (not administered)  ondansetron (ZOFRAN) tablet 4 mg (4 mg Oral Given 03/24/13 0626)  HYDROcodone-acetaminophen (NORCO/VICODIN) 5-325 MG per tablet 1 tablet (1 tablet Oral Given 03/24/13 0733)     Labs Reviewed  WET PREP, GENITAL - Abnormal; Notable for the following:    WBC, Wet Prep HPF POC FEW (*)    All other components within normal limits  GC/CHLAMYDIA PROBE AMP  URINALYSIS, ROUTINE W REFLEX MICROSCOPIC  PREGNANCY, URINE   US Renal  03/24/2013   *RADIOLOGY REPORT*  Clinical Data:  Left flank pain, nausea  RENAL/URINARY TRACT ULTRASOUND COMPLETE  Comparison:  Prior renal ultrasound 01/25/2013; prior CT abdomen/pelvis 01/17/2013  Findings:  Right Kidney:  Normal in size (11.9 cm in length) and parenchymal echogenicity.  No evidence of mass or hydronephrosis.  Left Kidney:  Normal in size (11.7 cm in length) and parenchymal echogenicity.  No evidence of mass or hydronephrosis.  Bladder:  Appears normal for degree of bladder distention. Bilateral ureteral jets are identified.  Other:  Technically difficult exam secondary to patient body habitus.  IMPRESSION:  1.  Normal sonographic appearance of the kidneys. 2.  Both ureteral jets are identified confirming patency of the ureters.   Original Report Authenticated By: Malachy Moan, M.D.     1. Cervicitis       MDM   Pt afebrile and non-toxic looking and admittedly anxious over her sx and recent admission for pyelo. Recent CT of abd/pelvis was negative. However,d/t pt hx and presenting sx, will do a renal u/s and pelvic to r/o stone, pelvic exam to r/o PID vs cervicitis vs flare up of sciatica.   Renal u/s and urinalysis negative. Pelvic exam shows small amount of cervical discharge in addition to some cervical tenderness on exam indicative more of a cervicitis than PID. Will give dose of azithromycin.   At this time there does not appear to be any evidence of an acute emergency medical condition and the patient appears stable for discharge with appropriate outpatient follow up.Diagnosis and return precautions were discussed with patient who verbalizes understanding and is agreeable to discharge. Pt case discussed with Dr. Norlene Campbell who agrees with plan.    Glade Nurse, PA-C 03/24/13 [phone]

## 2013-03-24 NOTE — ED Notes (Signed)
Pt states that she began to have left flank pain on Monday and tonight woke up with worsening pain and nausea; pt also reports that she has been urinating more frequently and c/o burning with urination this am.

## 2013-04-02 ENCOUNTER — Telehealth: Payer: Self-pay | Admitting: *Deleted

## 2013-04-02 ENCOUNTER — Ambulatory Visit: Payer: BC Managed Care – PPO | Admitting: Family Medicine

## 2013-04-02 DIAGNOSIS — Z0289 Encounter for other administrative examinations: Secondary | ICD-10-CM

## 2013-04-02 NOTE — Telephone Encounter (Signed)
VM left for pt that she was scheduled for same day acute appt, ear ache, and then was a no show?  What happened?

## 2013-04-06 ENCOUNTER — Encounter: Payer: Self-pay | Admitting: Family Medicine

## 2013-04-06 ENCOUNTER — Ambulatory Visit (INDEPENDENT_AMBULATORY_CARE_PROVIDER_SITE_OTHER): Payer: BC Managed Care – PPO | Admitting: Family Medicine

## 2013-04-06 VITALS — BP 124/80 | Temp 98.3°F | Wt 212.0 lb

## 2013-04-06 DIAGNOSIS — J069 Acute upper respiratory infection, unspecified: Secondary | ICD-10-CM

## 2013-04-06 DIAGNOSIS — J309 Allergic rhinitis, unspecified: Secondary | ICD-10-CM

## 2013-04-06 MED ORDER — FLUTICASONE PROPIONATE 50 MCG/ACT NA SUSP: 2.0000 | Freq: Every day | NASAL | Status: DC

## 2013-04-06 MED ORDER — HYDROCODONE-HOMATROPINE 5-1.5 MG/5ML PO SYRP: 5.0000 mL | ORAL_SOLUTION | Freq: Three times a day (TID) | ORAL | Status: DC | PRN

## 2013-04-06 NOTE — Progress Notes (Signed)
Chief Complaint  Patient presents with  . Otalgia    left  . sinus drainage    and cough, fever on and off on Sunday     HPI:  Acute visit for ear pain: -started abut 1 week ago -symptoms: sinus congestion, nasal congestion, drainage in throat, ear pain on L, cough, low grade fever 3 days ago, itchy and watery eyes -denies: fever in last few days, SOB, NVD -sick contact: none -has tried zyrtec and musinex -has seasonal allergies  ROS: See pertinent positives and negatives per HPI.  Past Medical History  Diagnosis Date  . Hypertension   . Hidradenitis suppurativa     Family History  Problem Relation Age of Onset  . Arthritis Mother   . Leukemia Mother   . Hypertension Father   . Arthritis Maternal Grandmother   . Hypertension Maternal Grandmother     History   Social History  . Marital Status: Married    Spouse Name: N/A    Number of Children: N/A  . Years of Education: N/A   Social History Main Topics  . Smoking status: Current Every Day Smoker -- 0.50 packs/day for 10 years    Types: Cigarettes  . Smokeless tobacco: Never Used  . Alcohol Use: No  . Drug Use: No  . Sexually Active: None   Other Topics Concern  . None   Social History Narrative  . None    Current outpatient prescriptions:cetirizine (ZYRTEC) 10 MG tablet, Take 10 mg by mouth daily., Disp: , Rfl: ;  ibuprofen (ADVIL,MOTRIN) 200 MG tablet, Take 200 mg by mouth every 6 (six) hours as needed for pain., Disp: , Rfl: ;  fluticasone (FLONASE) 50 MCG/ACT nasal spray, Place 2 sprays into the nose daily., Disp: 16 g, Rfl: 1 HYDROcodone-homatropine (HYCODAN) 5-1.5 MG/5ML syrup, Take 5 mLs by mouth every 8 (eight) hours as needed for cough., Disp: 120 mL, Rfl: 0;  [DISCONTINUED] hydrochlorothiazide 25 MG tablet, Take 25 mg by mouth daily.  , Disp: , Rfl: ;  [DISCONTINUED] sertraline (ZOLOFT) 50 MG tablet, Take 1 tablet (50 mg total) by mouth daily., Disp: 30 tablet, Rfl: 5  EXAM:  Filed Vitals:   04/06/13 0801  BP: 124/80  Temp: 98.3 F (36.8 C)    Body mass index is 36.37 kg/(m^2).  GENERAL: vitals reviewed and listed above, alert, oriented, appears well hydrated and in no acute distress  HEENT: atraumatic, conjunttiva clear, no obvious abnormalities on inspection of external nose and ears, normal appearance of ear canals and TMs with clear effusion L, clear nasal congestion with boggy turbinates, mild post oropharyngeal erythema with PND, no tonsillar edema or exudate, no sinus TTP  NECK: no obvious masses on inspection  LUNGS: clear to auscultation bilaterally, no wheezes, rales or rhonchi, good air movement  CV: HRRR, no peripheral edema  MS: moves all extremities without noticeable abnormality  PSYCH: pleasant and cooperative, no obvious depression or anxiety  ASSESSMENT AND PLAN:  Discussed the following assessment and plan:  Allergic rhinitis - Plan: HYDROcodone-homatropine (HYCODAN) 5-1.5 MG/5ML syrup, fluticasone (FLONASE) 50 MCG/ACT nasal spray  Upper respiratory infection - Plan: HYDROcodone-homatropine (HYCODAN) 5-1.5 MG/5ML syrup, fluticasone (FLONASE) 50 MCG/ACT nasal spray  -looks primarily allergy related on exam with possible VURI as well, no signs of bacterial illness. Recommendations per orders an instructions, risks and use of medications and return precautions discussed. -Patient advised to return or notify a doctor immediately if symptoms worsen or persist or new concerns arise.  Patient Instructions  -plenty  of rest and fluids  -flonase 2 sprays each nostril for 1 month, then 1 spray each nostril  -nasal saline wash 2-3 times daily (use prepackaged nasal saline or bottled/distilled water if making your own)   -clean nose with nasal saline before using the nasal steroid or sinex  -can use afrin - sinex nasal spray for drainage and nasal congestion - but do NOT use longer then 3-4 days  -can use tylenol or ibuprofen as directed for aches and  sorethroat  -in the winter time, using a humidifier at night is helpful (please follow cleaning instructions)  -if you are taking a cough medication - use only as directed, may also try a teaspoon of honey to coat the throat and throat lozenges  -for sore throat, salt water gargles can help  -follow up if you have fevers, facial pain, tooth pain, difficulty breathing or are worsening or not getting better in 5-7 days      KIM, HANNAH R.

## 2013-04-06 NOTE — Patient Instructions (Signed)
-  plenty of rest and fluids  -flonase 2 sprays each nostril for 1 month, then 1 spray each nostril  -nasal saline wash 2-3 times daily (use prepackaged nasal saline or bottled/distilled water if making your own)   -clean nose with nasal saline before using the nasal steroid or sinex  -can use afrin - sinex nasal spray for drainage and nasal congestion - but do NOT use longer then 3-4 days  -can use tylenol or ibuprofen as directed for aches and sorethroat  -in the winter time, using a humidifier at night is helpful (please follow cleaning instructions)  -if you are taking a cough medication - use only as directed, may also try a teaspoon of honey to coat the throat and throat lozenges  -for sore throat, salt water gargles can help  -follow up if you have fevers, facial pain, tooth pain, difficulty breathing or are worsening or not getting better in 5-7 days

## 2013-04-23 ENCOUNTER — Ambulatory Visit: Payer: BC Managed Care – PPO | Admitting: Family Medicine

## 2013-04-29 ENCOUNTER — Ambulatory Visit (INDEPENDENT_AMBULATORY_CARE_PROVIDER_SITE_OTHER): Payer: BC Managed Care – PPO | Admitting: Family Medicine

## 2013-04-29 ENCOUNTER — Encounter: Payer: Self-pay | Admitting: Family Medicine

## 2013-04-29 VITALS — BP 126/70 | HR 83 | Temp 99.2°F | Resp 16 | Wt 207.5 lb

## 2013-04-29 DIAGNOSIS — L659 Nonscarring hair loss, unspecified: Secondary | ICD-10-CM

## 2013-04-29 NOTE — Progress Notes (Signed)
  Subjective:    Patient ID: Jill Peters, female    DOB: 17-Jul-1981, 32 y.o.   MRN: [id number]  HPI Patient seen with diffuse alopecia Duration of one month. Sometimes puts her hair in a ponytail but no braiding She's under tremendous stress with grandmother dying of end-stage lung disease. Patient not had a recent thyroid functions. She does have some cold intolerance. No weight changes. No constipation. Does have increased fatigue.  Poor sleep quality. She's tried Benadryl without much relief. She is reluctant to take other medications  Past Medical History  Diagnosis Date  . Hypertension   . Hidradenitis suppurativa    Past Surgical History  Procedure Laterality Date  . Cystectomy  2005     removed from tail bone    reports that she has been smoking Cigarettes.  She has a 5 pack-year smoking history. She has never used smokeless tobacco. She reports that she does not drink alcohol or use illicit drugs. family history includes Arthritis in her maternal grandmother and mother; Hypertension in her father and maternal grandmother; and Leukemia in her mother. Allergies  Allergen Reactions  . Penicillins Anaphylaxis  . Naproxen Nausea Only      Review of Systems  Constitutional: Negative for appetite change and unexpected weight change.  Respiratory: Negative for cough and shortness of breath.   Cardiovascular: Negative for chest pain.  Endocrine: Positive for cold intolerance. Negative for heat intolerance, polydipsia and polyuria.  Skin: Negative for rash.       Objective:   Physical Exam  Constitutional: She appears well-developed and well-nourished.  HENT:  Patient has some diffuse thinning of hair. No localized alopecia. No rashes  Neck: Neck supple. No thyromegaly present.  Cardiovascular: Normal rate and regular rhythm.   Pulmonary/Chest: Effort normal and breath sounds normal. No respiratory distress. She has no wheezes. She has no rales.           Assessment & Plan:  Diffuse alopecia. Suspect related to stress. Check TSH. Avoid any traction

## 2013-04-29 NOTE — Patient Instructions (Addendum)

## 2013-05-03 ENCOUNTER — Telehealth: Payer: Self-pay | Admitting: Family Medicine

## 2013-05-03 NOTE — Telephone Encounter (Signed)
Caller: Jeffery/Spouse; Phone: [phone]; Reason for Call: Concerned about his wife who has been having panic attacks due to her hair falling out.  So he feels she may need something for her stress also.   Please give this message to Harriett Sine, Charity fundraiser.

## 2013-06-03 ENCOUNTER — Encounter: Payer: Self-pay | Admitting: Internal Medicine

## 2013-06-03 ENCOUNTER — Ambulatory Visit (INDEPENDENT_AMBULATORY_CARE_PROVIDER_SITE_OTHER): Payer: BC Managed Care – PPO | Admitting: Internal Medicine

## 2013-06-03 VITALS — BP 120/88 | Temp 98.7°F | Wt 202.0 lb

## 2013-06-03 DIAGNOSIS — L03019 Cellulitis of unspecified finger: Secondary | ICD-10-CM

## 2013-06-03 DIAGNOSIS — L03012 Cellulitis of left finger: Secondary | ICD-10-CM

## 2013-06-03 MED ORDER — HYDROCODONE-ACETAMINOPHEN 10-325 MG PO TABS: 1.0000 | ORAL_TABLET | Freq: Three times a day (TID) | ORAL | Status: DC | PRN

## 2013-06-03 MED ORDER — SULFAMETHOXAZOLE-TMP DS 800-160 MG PO TABS: 1.0000 | ORAL_TABLET | Freq: Two times a day (BID) | ORAL | Status: DC

## 2013-06-03 NOTE — Patient Instructions (Signed)
Take your antibiotic as prescribed until ALL of it is gone, but stop if you develop a rash, swelling, or any side effects of the medication.  Contact our office as soon as possible if  there are side effects of the medication.Fingertip Infection When an infection is around the nail, it is called a paronychia. When it appears over the tip of the finger, it is called a felon. These infections are due to minor injuries or cracks in the skin. If they are not treated properly, they can lead to bone infection and permanent damage to the fingernail. Incision and drainage is necessary if a pus pocket (an abscess) has formed. Antibiotics and pain medicine may also be needed. Keep your hand elevated for the next 2-3 days to reduce swelling and pain. If a pack was placed in the abscess, it should be removed in 1-2 days by your caregiver. Soak the finger in warm water for 20 minutes 4 times daily to help promote drainage. Keep the hands as dry as possible. Wear protective gloves with cotton liners. See your caregiver for follow-up care as recommended.  HOME CARE INSTRUCTIONS   Keep wound clean, dry and dressed as suggested by your caregiver.  Soak in warm salt water for fifteen minutes, four times per day for bacterial infections.  Your caregiver will prescribe an antibiotic if a bacterial infection is suspected. Take antibiotics as directed and finish the prescription, even if the problem appears to be improving before the medicine is gone.  Only take over-the-counter or prescription medicines for pain, discomfort, or fever as directed by your caregiver. SEEK IMMEDIATE MEDICAL CARE IF:  There is redness, swelling, or increasing pain in the wound.  Pus or any other unusual drainage is coming from the wound.  An unexplained oral temperature above 102 F (38.9 C) develops.  You notice a foul smell coming from the wound or dressing. MAKE SURE YOU:   Understand these instructions.  Monitor your  condition.  Contact your caregiver if you are getting worse or not improving. Document Released: 11/28/2004 Document Revised: 01/13/2012 Document Reviewed: 11/24/2008 [facility] Patient Information 2014 [place], [place].

## 2013-06-03 NOTE — Progress Notes (Signed)
  Subjective:    Patient ID: Jill Peters, female    DOB: 1981/02/24, 32 y.o.   MRN: [id number]  HPI  32 year old patient who jammed her finger recently and over the past 24 hours has developed worsening pain and swelling involving her left distal index finger.  Patient has a significant penicillin allergy  Past Medical History  Diagnosis Date  . Hypertension   . Hidradenitis suppurativa     History   Social History  . Marital Status: Married    Spouse Name: N/A    Number of Children: N/A  . Years of Education: N/A   Occupational History  . Not on file.   Social History Main Topics  . Smoking status: Current Every Day Smoker -- 0.50 packs/day for 10 years    Types: Cigarettes  . Smokeless tobacco: Never Used  . Alcohol Use: No  . Drug Use: No  . Sexually Active: Not on file   Other Topics Concern  . Not on file   Social History Narrative  . No narrative on file    Past Surgical History  Procedure Laterality Date  . Cystectomy  2005     removed from tail bone    Family History  Problem Relation Age of Onset  . Arthritis Mother   . Leukemia Mother   . Hypertension Father   . Arthritis Maternal Grandmother   . Hypertension Maternal Grandmother     Allergies  Allergen Reactions  . Penicillins Anaphylaxis  . Naproxen Nausea Only    Current Outpatient Prescriptions on File Prior to Visit  Medication Sig Dispense Refill  . acetaminophen (TYLENOL) 325 MG tablet Take 650 mg by mouth as needed for pain.      Marland Kitchen ibuprofen (ADVIL,MOTRIN) 200 MG tablet Take 200 mg by mouth every 6 (six) hours as needed for pain.      . [DISCONTINUED] hydrochlorothiazide 25 MG tablet Take 25 mg by mouth daily.        . [DISCONTINUED] sertraline (ZOLOFT) 50 MG tablet Take 1 tablet (50 mg total) by mouth daily.  30 tablet  5   No current facility-administered medications on file prior to visit.    BP 120/88  Temp(Src) 98.7 F (37.1 C) (Oral)  Wt 202 lb (91.627 kg)  BMI  34.66 kg/m2      Review of Systems  Skin: Positive for rash.       Objective:   Physical Exam  Constitutional: She appears well-developed and well-nourished. No distress.  Skin:  Paronychia involving the medial aspect of the left index finger without abscess          Assessment & Plan:    Paronychia without abscess left index finger. Local wound care discussed. She'll be placed on antibiotic therapy. She will soak 4 times daily and  attempt to keep elevated

## 2013-06-05 ENCOUNTER — Emergency Department (HOSPITAL_COMMUNITY)
Admission: EM | Admit: 2013-06-05 | Discharge: 2013-06-05 | Disposition: A | Discharge status: Home / Self Care | Payer: BC Managed Care – PPO | Attending: Emergency Medicine | Admitting: Emergency Medicine

## 2013-06-05 ENCOUNTER — Emergency Department (HOSPITAL_COMMUNITY): Payer: BC Managed Care – PPO

## 2013-06-05 ENCOUNTER — Encounter (HOSPITAL_COMMUNITY): Payer: Self-pay | Admitting: *Deleted

## 2013-06-05 DIAGNOSIS — Z872 Personal history of diseases of the skin and subcutaneous tissue: Secondary | ICD-10-CM | POA: Insufficient documentation

## 2013-06-05 DIAGNOSIS — W010XXA Fall on same level from slipping, tripping and stumbling without subsequent striking against object, initial encounter: Secondary | ICD-10-CM | POA: Insufficient documentation

## 2013-06-05 DIAGNOSIS — I10 Essential (primary) hypertension: Secondary | ICD-10-CM | POA: Insufficient documentation

## 2013-06-05 DIAGNOSIS — F17200 Nicotine dependence, unspecified, uncomplicated: Secondary | ICD-10-CM | POA: Insufficient documentation

## 2013-06-05 DIAGNOSIS — Y92009 Unspecified place in unspecified non-institutional (private) residence as the place of occurrence of the external cause: Secondary | ICD-10-CM | POA: Insufficient documentation

## 2013-06-05 DIAGNOSIS — S59909A Unspecified injury of unspecified elbow, initial encounter: Secondary | ICD-10-CM | POA: Insufficient documentation

## 2013-06-05 DIAGNOSIS — Y9301 Activity, walking, marching and hiking: Secondary | ICD-10-CM | POA: Insufficient documentation

## 2013-06-05 DIAGNOSIS — S6990XA Unspecified injury of unspecified wrist, hand and finger(s), initial encounter: Secondary | ICD-10-CM | POA: Insufficient documentation

## 2013-06-05 DIAGNOSIS — Z880 Allergy status to penicillin: Secondary | ICD-10-CM | POA: Insufficient documentation

## 2013-06-05 DIAGNOSIS — M25521 Pain in right elbow: Secondary | ICD-10-CM

## 2013-06-05 MED ORDER — ONDANSETRON 4 MG PO TBDP
4.0000 mg | ORAL_TABLET | Freq: Once | ORAL | Status: AC
  Administered 2013-06-05: 4 mg via ORAL
  Filled 2013-06-05: qty 1

## 2013-06-05 MED ORDER — OXYCODONE-ACETAMINOPHEN 5-325 MG PO TABS
1.0000 | ORAL_TABLET | Freq: Once | ORAL | Status: AC
  Administered 2013-06-05: 1 via ORAL
  Filled 2013-06-05: qty 1

## 2013-06-05 NOTE — ED Notes (Signed)
Patient is alert and oriented x3.  She is complaining of right arm pain that work her up at 2am. She states that she did fall yesterday where she fell on her right hip and arm.

## 2013-06-05 NOTE — ED Provider Notes (Signed)
Medical screening examination/treatment/procedure(s) were performed by non-physician practitioner and as supervising physician I was immediately available for consultation/collaboration.  Honore Wipperfurth L Dorance Spink, MD 06/05/13 0736 

## 2013-06-05 NOTE — ED Notes (Signed)
Patient is alert and oriented x3.  She was given DC instructions and follow up visit instructions.  Patient gave verbal understanding. She was DC ambulatory under her own power to home.  V/S stable.  He was not showing any signs of distress on DC 

## 2013-06-05 NOTE — ED Provider Notes (Signed)
CSN: [id number]     Arrival date & time 06/05/13  0447 History     First MD Initiated Contact with Patient 06/05/13 0507     Chief Complaint  Patient presents with  . Arm Pain    right arm   (Consider location/radiation/quality/duration/timing/severity/associated sxs/prior Treatment) The history is provided by the patient and medical records.   Patient presents to the ED complaining of right arm pain since approximately 2 AM. Patient states she fell yesterday at her home when walking on some concrete while wearing flip-flops. Reports her shoe slipped off, causing her to lose her balance. She fell landing on her right hip and right elbow. Denies any head trauma or loss of consciousness. Patient states initially after fall she felt fine, there was no pain. Patient states pain awoke her from sleep it has been persistent ever since with associated paresthesias of right forearm and hand.  States there is no pain with movement of elbow, only with direct contact.  No prior elbow or arm injury.  No meds taken PTA.  Past Medical History  Diagnosis Date  . Hypertension   . Hidradenitis suppurativa    Past Surgical History  Procedure Laterality Date  . Cystectomy  2005     removed from tail bone   Family History  Problem Relation Age of Onset  . Arthritis Mother   . Leukemia Mother   . Hypertension Father   . Arthritis Maternal Grandmother   . Hypertension Maternal Grandmother    History  Substance Use Topics  . Smoking status: Current Every Day Smoker -- 0.50 packs/day for 10 years    Types: Cigarettes  . Smokeless tobacco: Never Used  . Alcohol Use: No   OB History   Grav Para Term Preterm Abortions TAB SAB Ect Mult Living                 Review of Systems  Musculoskeletal: Positive for arthralgias.  All other systems reviewed and are negative.    Allergies  Penicillins and Naproxen  Home Medications   Current Outpatient Rx  Name  Route  Sig  Dispense  Refill  .  acetaminophen (TYLENOL) 325 MG tablet   Oral   Take 650 mg by mouth as needed for pain.         Marland Kitchen HYDROcodone-acetaminophen (NORCO) 10-325 MG per tablet   Oral   Take 1 tablet by mouth every 8 (eight) hours as needed for pain.   30 tablet   0   . ibuprofen (ADVIL,MOTRIN) 200 MG tablet   Oral   Take 200 mg by mouth every 6 (six) hours as needed for pain.         Marland Kitchen sulfamethoxazole-trimethoprim (BACTRIM DS) 800-160 MG per tablet   Oral   Take 1 tablet by mouth 2 (two) times daily.   14 tablet   0    BP 139/93  Pulse 83  Temp(Src) 98.2 F (36.8 C) (Oral)  Resp 22  Ht 5\' 4"  (1.626 m)  Wt 202 lb (91.627 kg)  BMI 34.66 kg/m2  SpO2 100%  LMP 05/19/2013  Physical Exam  Nursing note and vitals reviewed. Constitutional: She is oriented to person, place, and time. She appears well-developed and well-nourished. No distress.  HENT:  Head: Normocephalic and atraumatic.  Eyes: Conjunctivae and EOM are normal. Pupils are equal, round, and reactive to light.  Neck: Normal range of motion. Neck supple.  Cardiovascular: Normal rate, regular rhythm and normal heart sounds.   Pulmonary/Chest:  Effort normal and breath sounds normal. No respiratory distress. She has no wheezes.  Musculoskeletal: Normal range of motion. She exhibits no edema.       Right elbow: She exhibits normal range of motion, no swelling, no effusion, no deformity and no laceration. Tenderness found. Medial epicondyle, lateral epicondyle and olecranon process tenderness noted.  Diffuse TTP of right elbow without associated swelling, bruising, or noted deformity, full ROM maintained, strong radial pulse, sensation intact  Neurological: She is alert and oriented to person, place, and time.  Skin: Skin is warm and dry. She is not diaphoretic.  Psychiatric: She has a normal mood and affect.    ED Course   Procedures (including critical care time)  Labs Reviewed - No data to display Dg Elbow Complete  Right  06/05/2013   *RADIOLOGY REPORT*  Clinical Data: arm pain, status post fall  RIGHT ELBOW - COMPLETE 3+ VIEW  Comparison: None.  Findings: There is no acute fracture or dislocation.  No joint effusion.  Osseous mineralization is normal.  No radiopaque foreign body.  IMPRESSION: Normal radiograph of the right elbow with no acute fracture or dislocation.   Original Report Authenticated By: Rise Mu, M.D.    1. Elbow pain, right     MDM   X-ray negative for acute fracture or dislocation.  Paresthesias likely due to ulnar nerve irritation.  Patient was given 30 Vicodin by her PCP yesterday-- I've instructed her to continue taking this and follow RICE routine for added relief.  Discussed plan with pt, she agreed.  Return precautions advised.   Garlon Hatchet, PA-C 06/05/13 [phone]

## 2013-06-08 ENCOUNTER — Ambulatory Visit: Payer: BC Managed Care – PPO | Admitting: Family Medicine

## 2013-06-15 ENCOUNTER — Telehealth: Payer: Self-pay | Admitting: Family Medicine

## 2013-06-15 ENCOUNTER — Encounter: Payer: Self-pay | Admitting: Family Medicine

## 2013-06-15 ENCOUNTER — Ambulatory Visit (INDEPENDENT_AMBULATORY_CARE_PROVIDER_SITE_OTHER): Payer: BC Managed Care – PPO | Admitting: Family Medicine

## 2013-06-15 VITALS — BP 128/68 | HR 98 | Temp 98.2°F | Wt 207.0 lb

## 2013-06-15 DIAGNOSIS — F332 Major depressive disorder, recurrent severe without psychotic features: Secondary | ICD-10-CM

## 2013-06-15 MED ORDER — SERTRALINE HCL 50 MG PO TABS: 50.0000 mg | ORAL_TABLET | Freq: Every day | ORAL | Status: DC

## 2013-06-15 MED ORDER — ZOLPIDEM TARTRATE 10 MG PO TABS: 10.0000 mg | ORAL_TABLET | Freq: Every evening | ORAL | Status: DC | PRN

## 2013-06-15 NOTE — Telephone Encounter (Signed)
Husband was transferred to me. He wishes to make her an appt with anyone for lack of sleep, confusion, dizziness, depression. She has an appt tomorrow morning - husband said that was not soon enough and that he had thought about taking her to the hospital.Please advise re working her in today vs ED.

## 2013-06-15 NOTE — Progress Notes (Signed)
  Subjective:    Patient ID: Jill Peters, female    DOB: 1981/10/22, 32 y.o.   MRN: [id number]  HPI Patient seen with several week history of progressive depression. She's had past history of depression and has been tried on multiple antidepressants but had different side effects. She had nausea with Paxil and Celexa. She had increased anxiety with Wellbutrin.  She has difficulty with early morning awakening, decreased appetite, frequent crying spells, increased anxiousness, anhedonia.  Husband also has some concerns that she has some compulsive type behaviors such as repetitive cleaning. This is exacerbated with her current depression symptoms. She's had occasional fleeting suicidal thoughts but no active ideation. Recent TSH normal  Her grandmother is in hospice and this has been very difficult. Patient is prepared to start some counseling through hospice.  She is sometimes only getting about 2-3 hours sleep per night and this seems to be exacerbating her situation  Past Medical History  Diagnosis Date  . Hypertension   . Hidradenitis suppurativa    Past Surgical History  Procedure Laterality Date  . Cystectomy  2005     removed from tail bone    reports that she has been smoking Cigarettes.  She has a 5 pack-year smoking history. She has never used smokeless tobacco. She reports that she does not drink alcohol or use illicit drugs. family history includes Arthritis in her maternal grandmother and mother; Hypertension in her father and maternal grandmother; and Leukemia in her mother. Allergies  Allergen Reactions  . Penicillins Anaphylaxis  . Naproxen Nausea Only      Review of Systems  Constitutional: Positive for appetite change. Negative for fever and chills.  Respiratory: Negative for cough.   Cardiovascular: Negative for chest pain.  Psychiatric/Behavioral: Positive for sleep disturbance and dysphoric mood. Negative for confusion and self-injury. The patient is  nervous/anxious.        Objective:   Physical Exam  Constitutional: She is oriented to person, place, and time. She appears well-developed and well-nourished.  Neck: Neck supple. No thyromegaly present.  Cardiovascular: Normal rate and regular rhythm.  Exam reveals no gallop.   Pulmonary/Chest: Effort normal and breath sounds normal. No respiratory distress. She has no wheezes. She has no rales.  Neurological: She is alert and oriented to person, place, and time.  Psychiatric: Her behavior is normal. Judgment and thought content normal.  Depressed mood          Assessment & Plan:  Maj. depressive episode. We've recommended counseling which is available to her currently through hospice. Start sertraline 50 mg one half tablet daily for 3-4 days and if no nausea then titrate to one tablet. Limited Ambien 10 mg each bedtime when necessary insomnia. Reassess 3 weeks and sooner as needed

## 2013-06-15 NOTE — Telephone Encounter (Signed)
Patient Information:  Caller Name: Leotis Shames  Phone: [phone]  Patient: Jill Peters, Jill Peters  Gender: Female  DOB: 20-Aug-1981  Age: 32 Years  PCP: Evelena Peat [facility])  Pregnant: No  Office Follow Up:  Does the office need to follow up with this patient?: No  Instructions For The Office: N/A  RN Note:  Spouse states patient has anxiety, depression, difficulty sleeping. States patient has only slept approx. 10 hours in the past 7-10 days. Patient is eating and taking fluids. States patient is currently crying. Denies patient having suicidal thoughts. Care advice given per guidelines. Call back parameters reviewed. Spouse verbalizes understanding. Call transferred to [facility], in office, for appt. 06/15/13.   Symptoms  Reason For Call & Symptoms: Anxiety, depression, not sleeping  Reviewed Health History In EMR: Yes  Reviewed Medications In EMR: Yes  Reviewed Allergies In EMR: Yes  Reviewed Surgeries / Procedures: Yes  Date of Onset of Symptoms: 06/05/2013  Treatments Tried: Tylenol PM  Treatments Tried Worked: No OB / GYN:  LMP: 05/31/2013  Guideline(s) Used:  Depression  Disposition Per Guideline:   Call Local Agency Today  Reason For Disposition Reached:   Symptoms interfere with work or school  Advice Given:  Suggestions for Healthy Living  Eat healthy: Eat a well-balanced diet.  Get more sleep: Most people need 7-8 hours of sleep each night. Being well-rested improves your attitude and your sense of physical well-being.  Communicate: Share how you are feeling with someone in your life who is a good listener. Make certain that your spouse, family, or friends know how you are feeling.  Call Back If:  You feel like harming yourself  You become worse.  RN Overrode Recommendation:  Make Appointment  Nursing Judgement

## 2013-06-15 NOTE — Telephone Encounter (Signed)
Per Dr. Caryl Never he can see her today at the end of his schedule, pt can come around 11:30 AM and pt needs to be aware that we are squeezing in.

## 2013-06-15 NOTE — Telephone Encounter (Signed)
Appt made for 12:00. Pt aware.

## 2013-06-15 NOTE — Patient Instructions (Signed)
Start Sertraline at 25 mg (one half tablet) once daily for 3-4 days and then increase to one daily if no nausea.

## 2013-06-16 ENCOUNTER — Ambulatory Visit: Payer: BC Managed Care – PPO | Admitting: Family Medicine

## 2013-06-22 ENCOUNTER — Encounter: Payer: Self-pay | Admitting: Family Medicine

## 2013-06-22 ENCOUNTER — Ambulatory Visit (INDEPENDENT_AMBULATORY_CARE_PROVIDER_SITE_OTHER): Payer: BC Managed Care – PPO | Admitting: Family Medicine

## 2013-06-22 VITALS — BP 134/84 | HR 93 | Temp 98.0°F | Wt 199.0 lb

## 2013-06-22 DIAGNOSIS — R112 Nausea with vomiting, unspecified: Secondary | ICD-10-CM

## 2013-06-22 DIAGNOSIS — R109 Unspecified abdominal pain: Secondary | ICD-10-CM

## 2013-06-22 DIAGNOSIS — N926 Irregular menstruation, unspecified: Secondary | ICD-10-CM

## 2013-06-22 LAB — POCT URINALYSIS DIPSTICK
Bilirubin, UA: NEGATIVE
Ketones, UA: NEGATIVE
Protein, UA: NEGATIVE
Spec Grav, UA: 1.015
pH, UA: 7.5

## 2013-06-22 MED ORDER — ONDANSETRON 8 MG PO TBDP: 8.0000 mg | ORAL_TABLET | Freq: Three times a day (TID) | ORAL | Status: DC | PRN

## 2013-06-22 NOTE — Patient Instructions (Addendum)
Be in touch for any persistent or worsening symptoms

## 2013-06-22 NOTE — Progress Notes (Signed)
  Subjective:    Patient ID: Jill Peters, female    DOB: 05/07/1981, 32 y.o.   MRN: [id number]  HPI  Patient seen with some left flank pain. She developed over the weekend a couple days ago some nausea and a couple episodes of vomiting. She has not complained of any dysuria. She had episode of pyelonephritis few months ago and was concerned when she developed left flank pain. This morning she had some nonbloody diarrhea. She denies any fever. Her left flank pain is worse with movement and she thinks related to her vomiting which may have aggravated a pulled muscle. She denies any lower quadrant abdominal pain.  Last normal menstrual period was around July 1. She is slightly late. She did home pregnancy test couple days ago that was negative. She's had history of some irregular menses in the past, particularly when she was more overweight. Recent TSH normal  Just one week ago we started sertraline for depression. She denied any nausea after for starting that. She does not think this is medication related  Past Medical History  Diagnosis Date  . Hypertension   . Hidradenitis suppurativa    Past Surgical History  Procedure Laterality Date  . Cystectomy  2005     removed from tail bone    reports that she has been smoking Cigarettes.  She has a 5 pack-year smoking history. She has never used smokeless tobacco. She reports that she does not drink alcohol or use illicit drugs. family history includes Arthritis in her maternal grandmother and mother; Hypertension in her father and maternal grandmother; Leukemia in her mother. Allergies  Allergen Reactions  . Penicillins Anaphylaxis  . Naproxen Nausea Only     Review of Systems  Constitutional: Negative for fever and chills.  Respiratory: Negative for cough and shortness of breath.   Gastrointestinal: Positive for nausea, vomiting and diarrhea. Negative for abdominal pain, blood in stool and abdominal distention.  Endocrine: Negative  for polydipsia and polyuria.  Genitourinary: Positive for flank pain. Negative for dysuria and hematuria.  Skin: Negative for rash.  Neurological: Negative for dizziness.       Objective:   Physical Exam  Constitutional: She is oriented to person, place, and time. She appears well-developed and well-nourished.  HENT:  Tongue slightly dry, otherwise clear  Cardiovascular: Normal rate and regular rhythm.   Pulmonary/Chest: Effort normal and breath sounds normal. No respiratory distress. She has no wheezes. She has no rales.  Abdominal: Soft. Bowel sounds are normal. She exhibits no distension and no mass. There is no rebound and no guarding.  Minimal tenderness diffusely but no guarding or rebound. She has some minimal tenderness left and right upper quadrant  Musculoskeletal: She exhibits no edema.  Neurological: She is alert and oriented to person, place, and time.          Assessment & Plan:  #1 irregular menses with skipped menstrual period. Home pregnancy test negative. Repeat pregnancy test today negative. Recent TSH normal. Touch base if she's not had normal cycle by next month. #2 left flank pain. Suspect musculoskeletal. Urinalysis is normal. #3 nausea, vomiting, and diarrhea. Suspect viral gastroenteritis. Prescription for Zofran 8 mg as needed for nausea

## 2013-07-06 ENCOUNTER — Ambulatory Visit: Payer: BC Managed Care – PPO | Admitting: Family Medicine

## 2013-07-06 DIAGNOSIS — Z0289 Encounter for other administrative examinations: Secondary | ICD-10-CM

## 2013-07-20 ENCOUNTER — Encounter (HOSPITAL_COMMUNITY): Payer: Self-pay | Admitting: Psychology

## 2013-07-21 ENCOUNTER — Other Ambulatory Visit (HOSPITAL_COMMUNITY): Payer: BC Managed Care – PPO | Attending: Psychiatry | Admitting: Psychology

## 2013-07-21 DIAGNOSIS — F1120 Opioid dependence, uncomplicated: Secondary | ICD-10-CM

## 2013-07-21 DIAGNOSIS — F1320 Sedative, hypnotic or anxiolytic dependence, uncomplicated: Secondary | ICD-10-CM

## 2013-07-21 DIAGNOSIS — F329 Major depressive disorder, single episode, unspecified: Secondary | ICD-10-CM | POA: Insufficient documentation
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
## 2013-07-23 ENCOUNTER — Other Ambulatory Visit (HOSPITAL_COMMUNITY): Payer: BC Managed Care – PPO | Attending: Psychiatry | Admitting: Psychology

## 2013-07-23 DIAGNOSIS — F1320 Sedative, hypnotic or anxiolytic dependence, uncomplicated: Secondary | ICD-10-CM | POA: Insufficient documentation

## 2013-07-23 DIAGNOSIS — F1120 Opioid dependence, uncomplicated: Secondary | ICD-10-CM | POA: Insufficient documentation

## 2013-07-24 ENCOUNTER — Encounter (HOSPITAL_COMMUNITY): Payer: Self-pay | Admitting: Psychology

## 2013-07-24 DIAGNOSIS — F1320 Sedative, hypnotic or anxiolytic dependence, uncomplicated: Secondary | ICD-10-CM | POA: Insufficient documentation

## 2013-07-24 DIAGNOSIS — F1121 Opioid dependence, in remission: Secondary | ICD-10-CM | POA: Insufficient documentation

## 2013-07-24 NOTE — Progress Notes (Signed)
    Daily Group Progress Note  Program: CD-IOP   Group Time: 1-2:30 pm  Participation Level: Minimal  Behavioral Response: Appropriate  Type of Therapy: Activity Group  Topic: Guest Speaker: the first half of group was spent with a visitor. He was a former group member who has attained over 16 months of sobriety. The patient shared about his struggles and those practices that have allowed him to remain sober. The emphasis was on the importance of the Fellowship of AA/NA, attending meetings consistently, and securing a sponsor. He invited feedback and the visited generated some good discussion.   Group Time: 2:45-4pm  Participation Level: Minimal  Behavioral Response: Sharing  Type of Therapy: Process Group  Topic: Group Process: the second half of group was spent in process. Members shared about current issues and challenges in recovery. A new group member was present and she was invited to talk about what had brought her here and what she was seeking from the group. Drug tests collected on Monday were returned and three group members were asked to explain their positive results. None of them had shared anything of concern on Monday and had maintained their previous sobriety dates. The relapses were discussed and ways that they might have avoided the drug and alcohol use identified.  Summary: This group member was new to the group. She shared that she had never attended meetings until her husband asked her to go with him. She found them very helpful and really enjoyed them, but was very anxious about being around other people and would leave immediately upon the meeting ending. The patient admitted, though, she is becoming more comfortable in 'the rooms' and has met a number of women who have been very supportive and encouraging. This new member expressed concerns about the inability to say she will never use opiates again. Other group members reminded her she really couldn't guarantee  anything "forever' and she was encouraged to focus 'just on today'. The patient seemed comfortable and she made some good comments. Her sobriety date is 9/10.  Family Program: Family present? No   Name of family member(s):   UDS collected: No Results:   AA/NA attended?: YesMonday, Tuesday and Sunday  Sponsor?: No, but she is seeking a sponsor   Mahitha Hickling, LCAS

## 2013-07-26 ENCOUNTER — Encounter (HOSPITAL_COMMUNITY): Payer: Self-pay | Admitting: Psychology

## 2013-07-26 ENCOUNTER — Other Ambulatory Visit (HOSPITAL_COMMUNITY): Payer: BC Managed Care – PPO | Attending: Psychiatry | Admitting: Psychology

## 2013-07-26 DIAGNOSIS — F39 Unspecified mood [affective] disorder: Secondary | ICD-10-CM | POA: Insufficient documentation

## 2013-07-26 DIAGNOSIS — F411 Generalized anxiety disorder: Secondary | ICD-10-CM | POA: Insufficient documentation

## 2013-07-26 DIAGNOSIS — F1320 Sedative, hypnotic or anxiolytic dependence, uncomplicated: Secondary | ICD-10-CM

## 2013-07-26 DIAGNOSIS — F1120 Opioid dependence, uncomplicated: Secondary | ICD-10-CM

## 2013-07-26 NOTE — Progress Notes (Signed)
    Daily Group Progress Note  Program: CD-IOP   Group Time: 1-2:30 pm  Participation Level: Minimal  Behavioral Response: Sharing  Type of Therapy: Process Group  Topic: Group Process: the first part of group was spent in process. Members checked-in and a new group member introduced herself. During this session, the medical director was meeting with current members to discuss meds and the new group members. One member disclosed being shocked about a drug dream he had had the night before. Others also shared about drug dreams. One member shared about resentment with his sponsor and this generated a good discussion.   Group Time: 2:45-4pm  Participation Level: Active  Behavioral Response: Sharing  Type of Therapy: Psycho-education Group  Topic: Psycho-Ed: the second half of group was spent in a Matrix Slide Show on early recovery. Different aspects of recovery and the specific vulnerabilities at different times were identified. Members shared about how they had relapsed and were able to identify these vulnerabilities or lapses in their program. The new group member shared at length about her history of drug use and the specifics of her relapse after 3 years of sobriety. The session proved effective for everyone present.   Summary: The patient confirmed that she had had drug dreams in the past and it was weird, but she has tried not to let it bother her. She met with the medical director during the session for her first meeting in the program. She explained she has been struggling with sleep since coming off the opiates. The patient appeared very anxious with her leg bouncing significantly during the session. She agreed with the slide show and admitted she had tried to stop using on her own, but had only lasted about two weeks. She appears to be feeling more relaxed with the group and shared more openly today. Her sobriety date remains 9/10.    Family Program: Family present? No   Name  of family member(s):   UDS collected: No Results:   AA/NA attended?: YesWednesday and Thursday  Sponsor?: No, but she is seeking a temporary sponsor   Tammy Wickliffe, LCAS

## 2013-07-28 ENCOUNTER — Other Ambulatory Visit (HOSPITAL_COMMUNITY): Payer: BC Managed Care – PPO | Attending: Psychiatry | Admitting: Psychology

## 2013-07-28 DIAGNOSIS — F1120 Opioid dependence, uncomplicated: Secondary | ICD-10-CM | POA: Insufficient documentation

## 2013-07-28 DIAGNOSIS — F411 Generalized anxiety disorder: Secondary | ICD-10-CM | POA: Insufficient documentation

## 2013-07-28 DIAGNOSIS — F1320 Sedative, hypnotic or anxiolytic dependence, uncomplicated: Secondary | ICD-10-CM

## 2013-07-28 DIAGNOSIS — F329 Major depressive disorder, single episode, unspecified: Secondary | ICD-10-CM

## 2013-07-29 ENCOUNTER — Encounter (HOSPITAL_COMMUNITY): Payer: Self-pay | Admitting: Psychology

## 2013-07-29 DIAGNOSIS — F329 Major depressive disorder, single episode, unspecified: Secondary | ICD-10-CM | POA: Insufficient documentation

## 2013-07-29 HISTORY — DX: Major depressive disorder, single episode, unspecified: F32.9

## 2013-07-29 NOTE — Progress Notes (Signed)
   THERAPIST PROGRESS NOTE  Session Time: 9:30-10:30  Participation Level: Active  Behavioral Response: CasualAlertDepressed  Type of Therapy: Individual Therapy  Treatment Goals addressed: Anxiety and Diagnosis: 304.00  Interventions: Strength-based, Assertiveness Training, Supportive and Reframing  Summary: Jill Peters is a 32 y.o. female who presents with 2 weeks sober. She has attended 4 CDIOP sessions and is attending NA regularly. She came in to complete the treatment plan and her substance abuse history with this Clinical research associate.   Jill Peters was open and honest about her addiction and history of use. She was visibly upset when discussing her current living situation and the state of her relationship with her husband. She is invested in continuing CDIOP but cannot figure out how to do that if her husband does not return to work and she has to find a full-time job. Spent time discussing the difficulties of early sobriety and how she can continue getting the support she needs by staying in the program. They have a couples session scheduled with Charmian Muff today at 1600.   Pt reported some cravings but was able to verbalize the role NA meetings and working a program are playing in her maintained sobriety. The goals she wants to work on include improving her self-esteem and learning better how to handle her frustrations with wanting others to act and think the way she does. Next session scheduled for 08/02/13 at 11AM.   Suicidal/Homicidal: Nowithout intent/plan  Plan: Return again in 1 weeks.  Diagnosis: Axis I: Substance Abuse    Axis II: Deferred    [facility] 07/29/2013

## 2013-07-29 NOTE — Progress Notes (Signed)
    Daily Group Progress Note  Program: CD-IOP   Group Time: 1-2:30 pm  Participation Level: None  Behavioral Response: Appropriate  Type of Therapy: Education and Training Group  Topic: Education: The first half of group was spent with one of the pharmacists housed upstairs in the residential facility. Peggye Fothergill visited the group to discuss various categories of drugs, their effects and withdrawal symptoms, medications prescribed to address cravings and other complications along with prescribed medications for various diagnoses and mental illnesses. There were many questions and comments among the group and they stated the session proved very informative.  Group Time: 2:45- 4pm  Participation Level: Active  Behavioral Response: Appropriate and Sharing  Type of Therapy: Process Group  Topic:Group process: members discussed current issues and concerns in early recovery. One member expressed frustration with the difficulties and challenges in early recovery. Another expressed frustration with his sponsor. Group members provided good feedback and support to their fellow group members. Some drug tests were collected while others were returned from the previous collection.    Summary:The patient asked nothing of the pharmacist, but appeared attentive. She shared openly in process, though, and expressed frustration about her husband. He seems resistant to return to work and she questioned whether he is not just lazy? She reported she is not going to make any decisions for him and the group applauded her determination to establish boundaries and challenge him to assume accountability for his decisions. She wondered whether she is not making good progress in recovery while he is beginning to slip backwards? The patient had to be invited to share this information and was reminded this was not 'bitching', but expressing her feelings. She responded well to this intervention and her sobriety date  remains 9/10.    Family Program: Family present? No   Name of family member(s):   UDS collected: No Results:but last test was negative  AA/NA attended?: YesMonday, Tuesday and Wednesday  Sponsor?: No, but she reported she had at least 2-3 women she is considering   Aiden Helzer, LCAS

## 2013-07-30 ENCOUNTER — Encounter (HOSPITAL_COMMUNITY): Payer: Self-pay | Admitting: Psychology

## 2013-07-30 ENCOUNTER — Other Ambulatory Visit (HOSPITAL_COMMUNITY): Payer: BC Managed Care – PPO | Attending: Psychiatry | Admitting: Psychology

## 2013-07-30 DIAGNOSIS — F329 Major depressive disorder, single episode, unspecified: Secondary | ICD-10-CM

## 2013-07-30 DIAGNOSIS — F1120 Opioid dependence, uncomplicated: Secondary | ICD-10-CM

## 2013-07-30 DIAGNOSIS — F39 Unspecified mood [affective] disorder: Secondary | ICD-10-CM | POA: Insufficient documentation

## 2013-07-30 DIAGNOSIS — F411 Generalized anxiety disorder: Secondary | ICD-10-CM | POA: Insufficient documentation

## 2013-07-30 DIAGNOSIS — F1320 Sedative, hypnotic or anxiolytic dependence, uncomplicated: Secondary | ICD-10-CM

## 2013-07-30 MED ORDER — SERTRALINE HCL 100 MG PO TABS: 100.0000 mg | ORAL_TABLET | Freq: Every day | ORAL | Status: DC

## 2013-07-30 NOTE — Progress Notes (Signed)
Patient ID: KORRI ASK, female   DOB: 1981/10/10, 32 y.o.   MRN: [id number]   Subjective: Caliann asked to see this provider because she feels that her Zoloft dose needs to be increased. She is currently taking 50 mg and has been on it for 8 weeks. She reports that she was responding well to it, but over the past week she has experienced an increase in depression. She rates her depression currently as an 8 on a scale of 1-10 where 10 is the worst. She denies any suicidal or homicidal ideation. She denies any auditory or visual hallucinations. She reports that her energy is poor and she is poorly motivated. She endorses good sleep and appetite. She also endorses a fair amount of anxiety, and rates it as a 6 on a scale of 1-10. She reports that much of her decompensation in mood has to do with situations, but she is using her support network well.  Objective: Well-nourished and well-developed, fully alert and oriented, mildly dysphoric and anxious with congruent affect, speech is clear and coherent, eye contact is good, cognitive functioning is within normal limits, memory concentration are intact, no outward sign of psychosis or suicidality.  Assessment and Plan: We will increase her Zoloft to 100 mg daily. Prescription sent to patient's pharmacy.
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
## 2013-07-30 NOTE — Progress Notes (Signed)
    Daily Group Progress Note  Program: CD-IOP   Group Time: 1-2:30 pm  Participation Level: Active  Behavioral Response: Appropriate and Sharing  Type of Therapy: Process Group  Topic Group process; the first part of group was spent in process. Members shared bout the past weekend and any issues or challenges in early recovery. There were various events and struggles, but only one member relapsed. That relapse and the timeline of his day were drawn on the board and discussed at length. The patient was able to clearly articulate things he could have done instead of using.   Group Time: 2:45- 4pm  Participation Level: Active  Behavioral Response: Sharing  Type of Therapy: Psycho-education Group  Topic: Your Recovery IQ. The second half of group was spent in a psycho-ed piece with a handout from the [facility]. Members were asked to identify their "IQ" relative to 11 different ways to avoid relapse. The handout and ensuing discussion proved informative and all members shared what one thing they might do improve their IQ.  Summary: The patient was attentive and engaged. She reported she had had a good weekend and attended lots of meetings. She provided good feedback to the member who had relapsed and agreed it was difficult when rug dealers were calling you with drugs. The patient reported in the second half of group that she has really worked to be as honest as she could be with her husband. She pointed out that she has always been a people-pleaser, but she is going to change that and begin to focus on getting her own needs met. 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 The patient made some good comments and responded well to this intervention. Her sobriety date is 9/10.    Family Program: Family present? No   Name of family member(s):   UDS collected: Yes Results:  negative  AA/NA attended?: YesFriday, Saturday and Sunday  Sponsor?: No, but she is looking for a sponsor   Merrilee Ancona, LCAS

## 2013-08-02 ENCOUNTER — Encounter (HOSPITAL_COMMUNITY): Payer: Self-pay | Admitting: Psychology

## 2013-08-02 ENCOUNTER — Other Ambulatory Visit (HOSPITAL_COMMUNITY): Payer: BC Managed Care – PPO | Attending: Psychiatry | Admitting: Psychology

## 2013-08-02 DIAGNOSIS — F1120 Opioid dependence, uncomplicated: Secondary | ICD-10-CM

## 2013-08-02 DIAGNOSIS — F329 Major depressive disorder, single episode, unspecified: Secondary | ICD-10-CM

## 2013-08-02 DIAGNOSIS — F1320 Sedative, hypnotic or anxiolytic dependence, uncomplicated: Secondary | ICD-10-CM

## 2013-08-02 DIAGNOSIS — F39 Unspecified mood [affective] disorder: Secondary | ICD-10-CM | POA: Insufficient documentation

## 2013-08-02 DIAGNOSIS — F411 Generalized anxiety disorder: Secondary | ICD-10-CM | POA: Insufficient documentation

## 2013-08-02 NOTE — Progress Notes (Signed)
    Daily Group Progress Note  Program: CD-IOP   Group Time: 1-2:30 pm  Participation Level: Active  Behavioral Response: Sharing  Type of Therapy: Process Group  Topic: Group Process: The first half of group was spent in process. Members shared about the past few days since the group last met as well as plans for the weekend. One member admitted he had relapsed Wednesday evening. He described the chain of events that led to the relapse and members provided feedback about the relapse. Also present were two new group members who were asked to introduce themselves briefly during this half of group. During this session, the medical director pulled out two current members who had asked about speaking with him as well as a new group member who had first appeared this past Monday.   Group Time: 2:45- 4pm  Participation Level: Active  Behavioral Response: Appropriate and Sharing  Type of Therapy: Psycho-education Group  Topic: The Serenity Prayer/Graduation; The second half of group was spent in a psycho-ed session. It included a handout with the Serenity Prayer and a second page asking to identify what 5 things you can and cannot change. After being given a few minutes to consider the questions and indemnify answers, members shared their answers. At the end of this exercise, a graduation ceremony was held honoring a member who had already completed the program, but had not yet gone through the actual ceremony. She had appeared at the break and participated in the second half of group. There were kind words shared by members, new and old, and the graduating member shared heartfelt words about the importance of group and how much she missed the support and ability to share her feelings. Her words were very powerful and she urged the group to be as open and honest as they possibly could. This group session proved effective for all present.   Summary: The patient t reported she is doing okay. She s  attending lots meetings. She agreed with another member that having a bag of dope in her hands would be difficult to let go of. She expressed frustration with another member who didn't understand how difficult this would really be for an opiate-dependent person. The patient reported she could not make other people happy. She can change her own behaviors and enforce the boundaries she is currently identifying. She admitted that she is very frustrated with her husband and how negative and seemingly 'lazy' he is. The patient is doing well in recognizing how co-dependent she and her husband have become and she is working hard to set new boundaries and stop trying to 'fix him'. She now realizes that is his job. Her sobriety date remains 9/10.   Family Program: Family present? No   Name of family member(s):   UDS collected: No Results:   AA/NA attended?: YesWednesday and Thursday  Sponsor?: Yes   Lory Nowaczyk, LCAS

## 2013-08-04 ENCOUNTER — Encounter (HOSPITAL_COMMUNITY): Payer: Self-pay | Admitting: Psychology

## 2013-08-04 ENCOUNTER — Other Ambulatory Visit (HOSPITAL_COMMUNITY): Payer: BC Managed Care – PPO | Attending: Psychiatry | Admitting: Psychology

## 2013-08-04 DIAGNOSIS — F411 Generalized anxiety disorder: Secondary | ICD-10-CM | POA: Insufficient documentation

## 2013-08-04 DIAGNOSIS — F1320 Sedative, hypnotic or anxiolytic dependence, uncomplicated: Secondary | ICD-10-CM

## 2013-08-04 DIAGNOSIS — F1120 Opioid dependence, uncomplicated: Secondary | ICD-10-CM

## 2013-08-04 DIAGNOSIS — F329 Major depressive disorder, single episode, unspecified: Secondary | ICD-10-CM

## 2013-08-04 NOTE — Progress Notes (Signed)
    Daily Group Progress Note  Program: CD-IOP   Group Time: 1-2:30 pm  Participation Level: Active  Behavioral Response: Appropriate and Sharing  Type of Therapy: Process Group  Topic: Group Process; the first part of group was spent in process. Members shared about the past weekend and any issues or concerns in early recovery. One member admitted taking Ativan on Saturday and Sunday because of a non-stop migraine. Another group member reported he had taken some sleep medication because of his restless leg syndrome was keeping him from sleeping.  Another member reported he had secured a temporary sponsor and was supposed to meet him at his home tonight and then attend an AA meeting together. There was a good discussion and feedback provided among group members.   Group Time: 2:45- 4pm  Participation Level: Active  Behavioral Response: Sharing  Type of Therapy: Psycho-education Group  Topic: Communication Styles: Which One Are You? A psycho-ed session followed the break. A handout on communication was provided and members identified and discussed Four Communication Styles, including Aggressive, Passive, Passive-Aggressive, and Assertive. A discussion identifying the characteristics of each style and how they differ continued with members identifying the style they most displayed. The importance of being open, honest, and asking for what you need was emphasized. There was good feedback with most members agreeing they were passive-aggressive.    Summary: The patient reported she had had a good weekend and attended lots of meetings. She recommended one of the movies she and her husband had watched called "Blended". The patient made some good comments in the second half of group and was very engaged in the session. She is very committed to becoming more assertive and developing good boundaries, especially with her husband, who is also in recovery. She confessed, as two other female members  did, that she doesn't take compliments either and doesn't like them because she doesn't feel attractive or smart, or anything. The patient responded well to this intervention and her sobriety date remains 9/10.    Family Program: Family present? No   Name of family member(s):   UDS collected: Yes Results: not back yet  AA/NA attended?: YesFriday, Saturday and Sunday  Sponsor?: Yes   Lauren Aguayo, LCAS

## 2013-08-06 ENCOUNTER — Other Ambulatory Visit (HOSPITAL_COMMUNITY): Payer: BC Managed Care – PPO | Attending: Psychiatry | Admitting: Psychology

## 2013-08-06 DIAGNOSIS — F1120 Opioid dependence, uncomplicated: Secondary | ICD-10-CM

## 2013-08-06 DIAGNOSIS — F411 Generalized anxiety disorder: Secondary | ICD-10-CM | POA: Insufficient documentation

## 2013-08-06 DIAGNOSIS — F329 Major depressive disorder, single episode, unspecified: Secondary | ICD-10-CM

## 2013-08-06 DIAGNOSIS — F1320 Sedative, hypnotic or anxiolytic dependence, uncomplicated: Secondary | ICD-10-CM

## 2013-08-09 ENCOUNTER — Other Ambulatory Visit (HOSPITAL_COMMUNITY): Payer: BC Managed Care – PPO | Attending: Psychiatry | Admitting: Psychology

## 2013-08-09 ENCOUNTER — Encounter (HOSPITAL_COMMUNITY): Payer: Self-pay | Admitting: Psychology

## 2013-08-09 DIAGNOSIS — F11220 Opioid dependence with intoxication, uncomplicated: Secondary | ICD-10-CM
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
## 2013-08-10 ENCOUNTER — Encounter (HOSPITAL_COMMUNITY): Payer: Self-pay | Admitting: Psychology

## 2013-08-11 ENCOUNTER — Other Ambulatory Visit (HOSPITAL_COMMUNITY): Payer: BC Managed Care – PPO | Attending: Psychiatry | Admitting: Psychology

## 2013-08-11 DIAGNOSIS — F1320 Sedative, hypnotic or anxiolytic dependence, uncomplicated: Secondary | ICD-10-CM

## 2013-08-11 DIAGNOSIS — F1120 Opioid dependence, uncomplicated: Secondary | ICD-10-CM

## 2013-08-11 DIAGNOSIS — F329 Major depressive disorder, single episode, unspecified: Secondary | ICD-10-CM

## 2013-08-11 DIAGNOSIS — F411 Generalized anxiety disorder: Secondary | ICD-10-CM | POA: Insufficient documentation

## 2013-08-13 ENCOUNTER — Other Ambulatory Visit (HOSPITAL_COMMUNITY): Payer: BC Managed Care – PPO | Attending: Psychiatry | Admitting: Psychology

## 2013-08-13 DIAGNOSIS — F1120 Opioid dependence, uncomplicated: Secondary | ICD-10-CM

## 2013-08-13 DIAGNOSIS — F329 Major depressive disorder, single episode, unspecified: Secondary | ICD-10-CM

## 2013-08-13 DIAGNOSIS — F411 Generalized anxiety disorder: Secondary | ICD-10-CM | POA: Insufficient documentation

## 2013-08-13 DIAGNOSIS — F1320 Sedative, hypnotic or anxiolytic dependence, uncomplicated: Secondary | ICD-10-CM

## 2013-08-14 ENCOUNTER — Encounter (HOSPITAL_COMMUNITY): Payer: Self-pay | Admitting: Psychology

## 2013-08-14 NOTE — Progress Notes (Signed)
    Daily Group Progress Note  Program: CD-IOP   Group Time: 1-2:30 pm  Participation Level: Active  Behavioral Response: Appropriate and Sharing  Type of Therapy: Process Group  Topic:Group Process: the first part of group was spent in process. Members shared about challenges or struggles they are dealing with in early recovery. One member did not present as the group as known him and they continued to express concerns about him. Another member took a bottle of pills out of his pocket and  was immediately challenged by members. The session proved very difficult, with continued denial and frustration, but it provided a very different perspective for group members who were seeing things from the other side - as one looking as the addict is using.    Group Time: 2:45- 4pm  Participation Level: Active  Behavioral Response: Sharing  Type of Therapy: Psycho-education Group  Topic: Communication Styles: Part II. The second half of group was spent in an ongoing presentation on communication. The presentation explored the topic discussed initially during the last group session. The 'benefits' of passivity and passive-aggressive communication styles were discussed at length. Members made good comments and the session proved effective.   Summary: The patient reported she was doing fine and had attended a number of meetings. When asked what she was feeling, she refused to share at once, but upon further invitation, she challenged the group member than appeared impaired. She noted she had attended 12-step meetings very high, but didn't realize, until now, how powerful and destructive that had been to others invested and engaged in recovery. She also shared about her husband's grandmother and the angst she is feeling about her. The group provided good feedback and she admitted she felt better about it after having shared her concerns. The patient provided good feedback and comments about her own  passivity and she responded well to this intervention. The patient sobriety date remains 9/10.   Family Program: Family present? No   Name of family member(s):   UDS collected: No Results:   AA/NA attended?: YesMonday and Tuesday  Sponsor?: Yes   Taneesha Edgin, LCAS

## 2013-08-15 ENCOUNTER — Encounter (HOSPITAL_COMMUNITY): Payer: Self-pay | Admitting: Psychology

## 2013-08-15 NOTE — Progress Notes (Signed)
    Daily Group Progress Note  Program: CD-IOP   Group Time: 1-2:30 pm  Participation Level: Active  Behavioral Response: Appropriate and Sharing  Type of Therapy: Process Group  Topic: Group Process: the first part of group was spent in process. A fellow therapist, HB, appeared during this session. She shared about her experiences attending Al-Anon meetings and how much she has benefitted from them.  This therapist emphasized the importance of separating the "Addiction" from the "Person". She explained that the purpose of group, in part, is to point out any addictive behaviors, attitudes, and thoughts other group members may be displaying. Frequently, the addict is the last one to see what others can see clearly. We must help each other become more conscious and aware of how they present. There was good discussion and feedback among group members.  Group Time: 2:45- 4pm Participation Level: Active  Behavioral Response: Sharing  Type of Therapy: Psycho-education Group  Topic: Chaplain: second half of group was spent in a visit with one of the [employer]. He brought with him some quotes and a discussion on Forgiveness. Members shared their views on various quotes and provided their own explanations about forgiveness. There was a good disclosure among members and they responded very well to his visit and the intervention. At the break, it was recognized that one of the members appeared impaired. A BAC was collected and he was positive with a 1.3. His keys were collected and his wife contacted to drive to the clinic. The group shared their feelings about this event - the second in a week for the same member.   Summary: The patient reported she was doing well and had been attending meetings and calling other women in recovery. She responded well to the discussion on addressing the addiction and recognizing the difference between it and the person. The patient also  made some very good comments with the chaplain. After the [facility] was collected, the patient admitted she didn't understand why the group member kept coming to group despite using? She admitted she would never have come to group because it would have been so obvious to everyone. The patient displayed good insight and is growing in her confidence and assertive comments. Her sobriety date is 9/10.    Family Program: Family present? No   Name of family member(s):   UDS collected: Yes Results: negative  AA/NA attended?: YesMonday and Tuesday  Sponsor?: Yes   Vishaal Strollo, LCAS

## 2013-08-16 ENCOUNTER — Other Ambulatory Visit (HOSPITAL_COMMUNITY): Payer: Self-pay

## 2013-08-16 NOTE — Progress Notes (Signed)
    Daily Group Progress Note  Program: CD-IOP   Group Time: 1:00-2:30pm  Participation Level: Active  Behavioral Response: Sharing  Type of Therapy: Process Group  Topic: Group members checked in by sharing their sobriety dates and one thing they had done to support their recovery since the previous group session.  All group members reported attending AA or NA meetings over the last 2 days and discussed their experience at the meetings.  The group also discussed separating their behaviors during active addiction from themselves as people.   Group Time: 2:45-4:00  Participation Level: Active  Behavioral Response: Sharing  Type of Therapy: Psycho-education Group  Topic: The group completed the Wheel of Life exercise.  They rated their satisfaction with 8 different areas of life (physical environment/home, fun and recreation, health, personal growth and spirituality, significant other and romance, friends and family, career, and money) by creating a new outer edge in each section of the wheel, such that a "fuller" section indicated more satisfaction.  Group members first completed the exercise on paper and then shared their responses by marking a wheel drawn on the board and discussing their choices.  The group discussed the importance of balance in these different life areas and discussed areas where they could make improvements.  Summary: Patient reported a sobriety date of 9/10 and shared that she had attended meetings and met with her sponsor to support her recovery.  She shared that she had reached 30 days of sobriety and was planning to pick up her 30 day chip at a meeting later that night.  This was the longest she had been sober since she was 32 years old, and she shared feeling very proud of herself for this accomplishment.  During the Wheel of Life exercise, patient shared that she felt unsatisfied with her physical environment because she was still living in her husband's  grandmother's house, where she had used frequently before.  She also reported that the grandmother was not going to return to the home, and they had to find a new place to live within 30 days.   Family Program: Family present? No   Name of family member(s):   UDS collected: No Results:   AA/NA attended?: Yes Wednesday and Thursday  Sponsor?: Yes   Jill Peters, LCAS

## 2013-08-18 ENCOUNTER — Other Ambulatory Visit (HOSPITAL_COMMUNITY): Payer: Self-pay

## 2013-08-20 ENCOUNTER — Other Ambulatory Visit (HOSPITAL_COMMUNITY): Payer: Self-pay

## 2013-08-23 ENCOUNTER — Other Ambulatory Visit (HOSPITAL_COMMUNITY): Payer: Self-pay

## 2013-08-25 ENCOUNTER — Other Ambulatory Visit (HOSPITAL_COMMUNITY): Payer: Self-pay

## 2013-08-26 ENCOUNTER — Ambulatory Visit: Payer: Self-pay | Admitting: Internal Medicine

## 2013-08-26 DIAGNOSIS — Z0289 Encounter for other administrative examinations: Secondary | ICD-10-CM

## 2013-08-27 ENCOUNTER — Other Ambulatory Visit (HOSPITAL_COMMUNITY): Payer: Self-pay

## 2013-08-30 ENCOUNTER — Other Ambulatory Visit (HOSPITAL_COMMUNITY): Payer: Self-pay

## 2013-08-31 ENCOUNTER — Other Ambulatory Visit (HOSPITAL_COMMUNITY): Payer: Self-pay | Admitting: Physician Assistant

## 2013-09-01 ENCOUNTER — Other Ambulatory Visit (HOSPITAL_COMMUNITY): Payer: Self-pay

## 2013-09-03 ENCOUNTER — Other Ambulatory Visit (HOSPITAL_COMMUNITY): Payer: Self-pay

## 2013-09-06 ENCOUNTER — Other Ambulatory Visit (HOSPITAL_COMMUNITY): Payer: Self-pay

## 2013-09-07 ENCOUNTER — Other Ambulatory Visit: Payer: Self-pay

## 2013-09-07 MED ORDER — SERTRALINE HCL 100 MG PO TABS: 100.0000 mg | ORAL_TABLET | Freq: Every day | ORAL | Status: DC

## 2013-09-08 ENCOUNTER — Other Ambulatory Visit (HOSPITAL_COMMUNITY): Payer: Self-pay

## 2013-09-09 ENCOUNTER — Other Ambulatory Visit: Payer: Self-pay

## 2013-09-10 ENCOUNTER — Other Ambulatory Visit (HOSPITAL_COMMUNITY): Payer: Self-pay

## 2013-09-13 ENCOUNTER — Other Ambulatory Visit (HOSPITAL_COMMUNITY): Payer: Self-pay

## 2013-09-15 ENCOUNTER — Other Ambulatory Visit (HOSPITAL_COMMUNITY): Payer: Self-pay

## 2013-09-17 ENCOUNTER — Other Ambulatory Visit (HOSPITAL_COMMUNITY): Payer: Self-pay

## 2015-05-16 ENCOUNTER — Encounter (HOSPITAL_COMMUNITY): Payer: Self-pay | Admitting: Emergency Medicine

## 2015-05-16 ENCOUNTER — Emergency Department (HOSPITAL_COMMUNITY)
Admission: EM | Admit: 2015-05-16 | Discharge: 2015-05-16 | Disposition: A | Discharge status: Home / Self Care | Payer: Medicaid Other | Attending: Emergency Medicine | Admitting: Emergency Medicine

## 2015-05-16 DIAGNOSIS — L729 Follicular cyst of the skin and subcutaneous tissue, unspecified: Secondary | ICD-10-CM | POA: Diagnosis present

## 2015-05-16 DIAGNOSIS — E669 Obesity, unspecified: Secondary | ICD-10-CM | POA: Insufficient documentation

## 2015-05-16 DIAGNOSIS — L02411 Cutaneous abscess of right axilla: Secondary | ICD-10-CM

## 2015-05-16 DIAGNOSIS — I10 Essential (primary) hypertension: Secondary | ICD-10-CM | POA: Diagnosis not present

## 2015-05-16 DIAGNOSIS — Z720 Tobacco use: Secondary | ICD-10-CM | POA: Diagnosis not present

## 2015-05-16 MED ORDER — SULFAMETHOXAZOLE-TRIMETHOPRIM 800-160 MG PO TABS: 1.0000 | ORAL_TABLET | Freq: Two times a day (BID) | ORAL | Status: AC

## 2015-05-16 MED ORDER — HYDROCODONE-ACETAMINOPHEN 5-325 MG PO TABS: 1.0000 | ORAL_TABLET | ORAL | Status: DC | PRN

## 2015-05-16 MED ORDER — LIDOCAINE HCL (PF) 1 % IJ SOLN
5.0000 mL | Freq: Once | INTRAMUSCULAR | Status: DC
  Filled 2015-05-16: qty 5

## 2015-05-16 NOTE — ED Notes (Signed)
Boil under right arm for last 4 days.  Rates pain 8/10.  No drainage noted.

## 2015-05-16 NOTE — Discharge Instructions (Signed)

## 2015-05-18 NOTE — ED Provider Notes (Signed)
CSN: [id number]     Arrival date & time 05/16/15  1146 History   First MD Initiated Contact with Patient 05/16/15 1331     Chief Complaint  Patient presents with  . Cyst    under right arm.     (Consider location/radiation/quality/duration/timing/severity/associated sxs/prior Treatment) The history is provided by the patient.   Jill Peters is a 34 y.o. female with a history of hidradenitis suppurativa presenting with a 4 day history of worsening abscess in her right axilla.  She is visiting family here this week from out of town.  She has tried using warm compresses but the site has not started to drain but has become larger and more painful.  She denies fevers or chills, nausea, headache or other complaint.  Her infections usually drain spontaneously but has had a few incised in the past.  She has taken ibuprofen without relief of pain.    Past Medical History  Diagnosis Date  . Hypertension   . Hidradenitis suppurativa   . Obesity    Past Surgical History  Procedure Laterality Date  . Cystectomy  2005     removed from tail bone  . Wisdom tooth extraction  age 11  . Cholecystectomy     Family History  Problem Relation Age of Onset  . Arthritis Mother   . Leukemia Mother   . Hypertension Father   . Alcohol abuse Father   . Arthritis Maternal Grandmother   . Hypertension Maternal Grandmother   . Bipolar disorder Father   . Alcohol abuse Cousin   . Alcohol abuse Brother     1/2 brother  . Alcohol abuse Brother     1/2 brother   History  Substance Use Topics  . Smoking status: Current Every Day Smoker -- 0.50 packs/day for 10 years    Types: Cigarettes  . Smokeless tobacco: Never Used  . Alcohol Use: No   OB History    No data available     Review of Systems  Constitutional: Negative for fever and chills.  Respiratory: Negative for shortness of breath and wheezing.   Skin: Positive for color change.       Negative except as mentioned in HPI.     Neurological: Negative for numbness.      Allergies  Penicillins; Amoxicillin; and Naproxen  Home Medications   Prior to Admission medications   Medication Sig Start Date End Date Taking? Authorizing Provider  ibuprofen (ADVIL,MOTRIN) 200 MG tablet Take 200 mg by mouth every 6 (six) hours as needed for pain.   Yes Historical Provider, MD  HYDROcodone-acetaminophen (NORCO/VICODIN) 5-325 MG per tablet Take 1 tablet by mouth every 4 (four) hours as needed. 05/16/15   Burgess Amor, PA-C  sulfamethoxazole-trimethoprim (BACTRIM DS,SEPTRA DS) 800-160 MG per tablet Take 1 tablet by mouth 2 (two) times daily. 05/16/15 05/23/15  Burgess Amor, PA-C   BP 111/60 mmHg  Pulse 83  Temp(Src) 98.4 F (36.9 C) (Oral)  Resp 16  Ht  (1.626 m)  Wt 220 lb (99.791 kg)  BMI 37.74 kg/m2  SpO2 100%  LMP 04/25/2015 Physical Exam  Constitutional: She is oriented to person, place, and time. She appears well-developed and well-nourished.  HENT:  Head: Normocephalic.  Cardiovascular: Normal rate.   Pulmonary/Chest: Effort normal.  Musculoskeletal: She exhibits tenderness.  Neurological: She is alert and oriented to person, place, and time. No sensory deficit.  Skin:  Elongated abscess right axillary fold with the upper portion indurated, the lower portion more fluctuant,  approximately 5 cm in length. No drainage, 2 cm surrounding erythema.    ED Course  Procedures (including critical care time)  INCISION AND DRAINAGE Performed by: Burgess AmorIDOL, Shaden Higley Consent: Verbal consent obtained. Risks and benefits: risks, benefits and alternatives were discussed Type: abscess  Body area: right axilla  Anesthesia: local infiltration  Incision was made with a scalpel.  Local anesthetic: lidocaine 1% without epinephrine  Anesthetic total: 5 ml  Complexity: complex Blunt dissection to break up loculations  Drainage: purulent  Drainage amount: copious  Packing material: na  Patient tolerance: Patient  tolerated the procedure well with no immediate complications.    Labs Review Labs Reviewed - No data to display  Imaging Review No results found.   EKG Interpretation None      MDM   Final diagnoses:  Abscess of axilla, right    Pt understands to be liberal with warm soaks and gentle massage to keep the site opened and draining.  She was prescribed bactrim and hydrocodone.  Advised recheck here or by home pcp if sx persist or worsen.  The patient appears reasonably screened and/or stabilized for discharge and I doubt any other medical condition or other [facility] requiring further screening, evaluation, or treatment in the ED at this time prior to discharge.     Burgess AmorJulie Jazzmine Kleiman, PA-C 05/18/15 2148  Bethann BerkshireJoseph Zammit, MD 05/22/15 [phone]

## 2016-10-17 NOTE — Progress Notes (Deleted)
Psychiatric Initial Adult Assessment   Patient Identification: Jill Peters MRN:  [id number] Date of Evaluation:  10/17/2016 Referral Source: *** Chief Complaint:   Visit Diagnosis: No diagnosis found.  History of Present Illness:   Jill Peters is a 35 year old female with  hidradenitis suppurativa   Associated Signs/Symptoms: Depression Symptoms:  {DEPRESSION SYMPTOMS:20000} (Hypo) Manic Symptoms:  {BHH MANIC SYMPTOMS:22872} Anxiety Symptoms:  {BHH ANXIETY SYMPTOMS:22873} Psychotic Symptoms:  {BHH PSYCHOTIC SYMPTOMS:22874} PTSD Symptoms: {BHH PTSD SYMPTOMS:22875}  Past Psychiatric History: ***  Previous Psychotropic Medications: {YES/NO:21197}  Substance Abuse History in the last 12 months:  {yes no:314532}  Consequences of Substance Abuse: {BHH CONSEQUENCES OF SUBSTANCE ABUSE:22880}  Past Medical History:  Past Medical History:  Diagnosis Date  . Hidradenitis suppurativa   . Hypertension   . Obesity     Past Surgical History:  Procedure Laterality Date  . CHOLECYSTECTOMY    . CYSTECTOMY  2005    removed from tail bone  . WISDOM TOOTH EXTRACTION  age 35    Family Psychiatric History: ***  Family History:  Family History  Problem Relation Age of Onset  . Arthritis Mother   . Leukemia Mother   . Hypertension Father   . Alcohol abuse Father   . Arthritis Maternal Grandmother   . Hypertension Maternal Grandmother   . Bipolar disorder Father   . Alcohol abuse Cousin   . Alcohol abuse Brother     1/2 brother  . Alcohol abuse Brother     1/2 brother    Social History:   Social History   Social History  . Marital status: Married    Spouse name: N/A  . Number of children: N/A  . Years of education: N/A   Social History Main Topics  . Smoking status: Current Every Day Smoker    Packs/day: 0.50    Years: 10.00    Types: Cigarettes  . Smokeless tobacco: Never Used  . Alcohol use No  . Drug use:     Types: Hydrocodone  . Sexual activity:  Yes    Partners: Male   Other Topics Concern  . Not on file   Social History Narrative   07/23/2013 AHW [name] was born and grew [place]. She has 3 half-siblings by her father. She reports her childhood was good. She graduated from high school, and has done some online college studies in early childhood development. She is currently unemployed. She has been married for 10 years. She has no children. She denies any legal difficulties. She grew up as a [employer], and reports that she is now a [employer]. Her hobbies include writing, watching movies, listening to music, shopping, and cleaning. Her social support system consists of her husband and her friends in the [employer] community. 07/23/2013 AHW    Additional Social History: ***  Allergies:   Allergies  Allergen Reactions  . Penicillins Anaphylaxis  . Amoxicillin   . Naproxen Nausea Only    Metabolic Disorder Labs: No results found for: HGBA1C, MPG No results found for: PROLACTIN No results found for: CHOL, TRIG, HDL, CHOLHDL, VLDL, LDLCALC   Current Medications: Current Outpatient Prescriptions  Medication Sig Dispense Refill  . HYDROcodone-acetaminophen (NORCO/VICODIN) 5-325 MG per tablet Take 1 tablet by mouth every 4 (four) hours as needed. 20 tablet 0  . ibuprofen (ADVIL,MOTRIN) 200 MG tablet Take 200 mg by mouth every 6 (six) hours as needed for pain.     No current facility-administered medications for this  visit.     Neurologic: Headache: {BHH YES OR NO:22294} Seizure: {BHH YES OR NO:22294} Paresthesias:{BHH YES OR ZO:10960}O:22294}  Musculoskeletal: Strength & Muscle Tone: {desc; muscle tone:32375} Gait & Station: {PE GAIT ED AVWU:98119}ATL:22525} Patient leans: {Patient Leans:21022755}  Psychiatric Specialty Exam: ROS  There were no vitals taken for this visit.There is no height or weight on file to calculate BMI.  General Appearance: {Appearance:22683}  Eye Contact:  {BHH EYE  CONTACT:22684}  Speech:  {Speech:22685}  Volume:  {Volume (PAA):22686}  Mood:  {BHH MOOD:22306}  Affect:  {Affect (PAA):22687}  Thought Process:  {Thought Process (PAA):22688}  Orientation:  {BHH ORIENTATION (PAA):22689}  Thought Content:  {Thought Content:22690}  Suicidal Thoughts:  {ST/HT (PAA):22692}  Homicidal Thoughts:  {ST/HT (PAA):22692}  Memory:  {BHH MEMORY:22881}  Judgement:  {Judgement (PAA):22694}  Insight:  {Insight (PAA):22695}  Psychomotor Activity:  {Psychomotor (PAA):22696}  Concentration:  {Concentration:21399}  Recall:  {BHH GOOD/FAIR/POOR:22877}  Fund of Knowledge:{BHH GOOD/FAIR/POOR:22877}  Language: {BHH GOOD/FAIR/POOR:22877}  Akathisia:  {BHH YES OR NO:22294}  Handed:  {Handed:22697}  AIMS (if indicated):  ***  Assets:  {Assets (PAA):22698}  ADL's:  {BHH JYN'W:29562}ADL'S:22290}  Cognition: {chl bhh cognition:304700322}  Sleep:  ***    Treatment Plan Summary: {CHL AMB BH MD TX ZHYQ:[id number]}Plan:[phone]}   Jill Hottereina Kasiah Manka, MD 12/14/20173:03 PM

## 2016-10-22 ENCOUNTER — Ambulatory Visit (HOSPITAL_COMMUNITY): Payer: Self-pay | Admitting: Psychiatry

## 2017-01-20 ENCOUNTER — Emergency Department (HOSPITAL_COMMUNITY)
Admission: EM | Admit: 2017-01-20 | Discharge: 2017-01-20 | Disposition: A | Discharge status: Home / Self Care | Payer: 59 | Attending: Emergency Medicine | Admitting: Emergency Medicine

## 2017-01-20 ENCOUNTER — Encounter (HOSPITAL_COMMUNITY): Payer: Self-pay | Admitting: Emergency Medicine

## 2017-01-20 DIAGNOSIS — Z79899 Other long term (current) drug therapy: Secondary | ICD-10-CM | POA: Insufficient documentation

## 2017-01-20 DIAGNOSIS — Z791 Long term (current) use of non-steroidal anti-inflammatories (NSAID): Secondary | ICD-10-CM | POA: Insufficient documentation

## 2017-01-20 DIAGNOSIS — N644 Mastodynia: Secondary | ICD-10-CM | POA: Diagnosis present

## 2017-01-20 DIAGNOSIS — F17210 Nicotine dependence, cigarettes, uncomplicated: Secondary | ICD-10-CM | POA: Insufficient documentation

## 2017-01-20 DIAGNOSIS — I10 Essential (primary) hypertension: Secondary | ICD-10-CM | POA: Insufficient documentation

## 2017-01-20 DIAGNOSIS — N6041 Mammary duct ectasia of right breast: Secondary | ICD-10-CM | POA: Insufficient documentation

## 2017-01-20 HISTORY — DX: Anxiety disorder, unspecified: F41.9

## 2017-01-20 MED ORDER — SULFAMETHOXAZOLE-TRIMETHOPRIM 800-160 MG PO TABS: 1.0000 | ORAL_TABLET | Freq: Two times a day (BID) | ORAL | 0 refills | Status: AC

## 2017-01-20 NOTE — ED Triage Notes (Signed)
Pt reports right breast tenderness, redness. Pt denies any known fevers, n/v.

## 2017-01-20 NOTE — Discharge Instructions (Signed)
Warm compresses on/off may help.  Be sure to take the antibiotic as directed until its finished.  Try to stop smoking.  Follow-up with your GYN or return here for any worsening ysmptoms

## 2017-01-20 NOTE — ED Provider Notes (Signed)
[facility]-EMERGENCY DEPT Provider Note   CSN: [id number] Arrival date & time: 01/20/17  4098     History   Chief Complaint Chief Complaint  Patient presents with  . Breast Pain    HPI Jill Peters is a 36 y.o. female.  HPI   Jill Peters is a 36 y.o. female who presents to the Emergency Department complaining of pain and redness to the right breast.  Symptoms developed 1-2 days prior.  She describes a stinging, throbbing, burning pain to the nipple area.  Pain is worse with palpation and movement of the breast.  She denies fever, chills, nipple discharge or noticeable mass of her breast.  No recent pregnancy or lactation.  She has hx of hidradenitis suppurativa and frequent boils.  Pt is a smoker.    Past Medical History:  Diagnosis Date  . Anxiety   . Hidradenitis suppurativa   . Hypertension   . Obesity     Patient Active Problem List   Diagnosis Date Noted  . Major depression 07/29/2013  . Opioid dependence (HCC) 07/24/2013  . Benzodiazepine dependence (HCC) 07/24/2013  . Pyelonephritis 01/20/2013  . SIRS (systemic inflammatory response syndrome) (HCC) 01/20/2013  . Hyponatremia 01/20/2013  . Lumbar back pain 08/12/2011  . GERD (gastroesophageal reflux disease) 07/17/2011  . Hypertension 07/17/2011  . Hidradenitis suppurativa 07/17/2011    Past Surgical History:  Procedure Laterality Date  . CHOLECYSTECTOMY    . CYSTECTOMY  2005    removed from tail bone  . WISDOM TOOTH EXTRACTION  age 34    OB History    No data available       Home Medications    Prior to Admission medications   Medication Sig Start Date End Date Taking? Authorizing Provider  HYDROcodone-acetaminophen (NORCO/VICODIN) 5-325 MG per tablet Take 1 tablet by mouth every 4 (four) hours as needed. 05/16/15   Burgess Amor, PA-C  ibuprofen (ADVIL,MOTRIN) 200 MG tablet Take 200 mg by mouth every 6 (six) hours as needed for pain.    Historical Provider, MD    Family History Family History    Problem Relation Age of Onset  . Arthritis Mother   . Leukemia Mother   . Hypertension Father   . Alcohol abuse Father   . Bipolar disorder Father   . Arthritis Maternal Grandmother   . Hypertension Maternal Grandmother   . Alcohol abuse Cousin   . Alcohol abuse Brother     1/2 brother  . Alcohol abuse Brother     1/2 brother    Social History Social History  Substance Use Topics  . Smoking status: Current Every Day Smoker    Packs/day: 0.50    Years: 10.00    Types: Cigarettes  . Smokeless tobacco: Never Used  . Alcohol use No     Allergies   Penicillins; Amoxicillin; and Naproxen   Review of Systems Review of Systems  Constitutional: Negative for chills and fever.  Respiratory: Negative for shortness of breath.   Gastrointestinal: Negative for abdominal pain, nausea and vomiting.  Genitourinary:       Right breast redness and pain  Musculoskeletal: Negative for arthralgias and joint swelling.  Skin: Negative for color change and rash.  Neurological: Negative for dizziness and headaches.  Hematological: Negative for adenopathy.  All other systems reviewed and are negative.    Physical Exam Updated Vital Signs BP (!) 137/105 (BP Location: Left Arm) Comment: nad noted. bp x2.  Pulse 97   Temp 98.6 F (37 C) (  Oral)   Resp 18   Ht 5' 4.5" (1.638 m)   Wt 65.8 kg   LMP 01/13/2017   SpO2 100%   BMI 24.50 kg/m   Physical Exam  Constitutional: She is oriented to person, place, and time. She appears well-developed and well-nourished. No distress.  HENT:  Head: Normocephalic and atraumatic.  Mouth/Throat: Oropharynx is clear and moist.  Cardiovascular: Normal rate, regular rhythm and normal heart sounds.   No murmur heard. Pulmonary/Chest: Effort normal and breath sounds normal. No respiratory distress. She exhibits no bony tenderness. Right breast exhibits tenderness. Right breast exhibits no inverted nipple, no mass and no nipple discharge. Left breast  exhibits no inverted nipple and no mass.    Focal tenderness and mild induration of the right nipple and upper areola.  Mild erythema as well.  No nipple drainage.  No palpable masses of the breast  Abdominal: Soft. There is no tenderness.  Lymphadenopathy:    She has no cervical adenopathy.    She has no axillary adenopathy.  Neurological: She is alert and oriented to person, place, and time. She exhibits normal muscle tone. Coordination normal.  Skin: Skin is warm and dry. No rash noted.  Psychiatric: She has a normal mood and affect.  Nursing note and vitals reviewed.    ED Treatments / Results  Labs (all labs ordered are listed, but only abnormal results are displayed) Labs Reviewed - No data to display  EKG  EKG Interpretation None       Radiology No results found.  Procedures Procedures (including critical care time)  Medications Ordered in ED Medications - No data to display   Initial Impression / Assessment and Plan / ED Course  I have reviewed the triage vital signs and the nursing notes.  Pertinent labs & imaging results that were available during my care of the patient were reviewed by me and considered in my medical decision making (see chart for details).     Pt well appearing.  vitals stable.  Focal tenderness and erythema of the right nipple/areola area without palpable masses.  Pt has hx of hidradenitis suppurativa and she is a smoker.  Sx's likely related to periductal mastitis.    Will tx with abx, warm compresses and she agrees to arrange close f/u with GYN in [place].  Counseled pt on importance of smoking cessation.  Return precautions discussed.   Final Clinical Impressions(s) / ED Diagnoses   Final diagnoses:  Periductal mastitis of right breast    New Prescriptions New Prescriptions   No medications on file     Pauline Ausammy Kenzly Rogoff, Cordelia Poche-C 01/20/17 0936    Bethann BerkshireJoseph Zammit, MD 01/20/17 [id number]

## 2017-10-22 ENCOUNTER — Other Ambulatory Visit: Payer: Self-pay

## 2017-10-22 ENCOUNTER — Encounter (HOSPITAL_COMMUNITY): Payer: Self-pay | Admitting: Emergency Medicine

## 2017-10-22 ENCOUNTER — Emergency Department (HOSPITAL_COMMUNITY)
Admission: EM | Admit: 2017-10-22 | Discharge: 2017-10-23 | Disposition: A | Discharge status: Home / Self Care | Payer: 59 | Attending: Emergency Medicine | Admitting: Emergency Medicine

## 2017-10-22 ENCOUNTER — Emergency Department (HOSPITAL_COMMUNITY): Payer: 59

## 2017-10-22 DIAGNOSIS — Z79891 Long term (current) use of opiate analgesic: Secondary | ICD-10-CM | POA: Insufficient documentation

## 2017-10-22 DIAGNOSIS — R440 Auditory hallucinations: Secondary | ICD-10-CM | POA: Insufficient documentation

## 2017-10-22 DIAGNOSIS — I10 Essential (primary) hypertension: Secondary | ICD-10-CM | POA: Diagnosis not present

## 2017-10-22 DIAGNOSIS — E860 Dehydration: Secondary | ICD-10-CM | POA: Insufficient documentation

## 2017-10-22 DIAGNOSIS — F17210 Nicotine dependence, cigarettes, uncomplicated: Secondary | ICD-10-CM | POA: Diagnosis not present

## 2017-10-22 DIAGNOSIS — F329 Major depressive disorder, single episode, unspecified: Secondary | ICD-10-CM | POA: Insufficient documentation

## 2017-10-22 DIAGNOSIS — F32A Depression, unspecified: Secondary | ICD-10-CM

## 2017-10-22 HISTORY — DX: Bipolar disorder, unspecified: F31.9

## 2017-10-22 LAB — COMPREHENSIVE METABOLIC PANEL
ALBUMIN: 4.3 g/dL (ref 3.5–5.0)
ALK PHOS: 73 U/L (ref 38–126)
ALT: 21 U/L (ref 14–54)
AST: 29 U/L (ref 15–41)
Anion gap: 16 — ABNORMAL HIGH (ref 5–15)
BILIRUBIN TOTAL: 0.5 mg/dL (ref 0.3–1.2)
BUN: 8 mg/dL (ref 6–20)
CALCIUM: 9.1 mg/dL (ref 8.9–10.3)
CO2: 21 mmol/L — AB (ref 22–32)
Chloride: 96 mmol/L — ABNORMAL LOW (ref 101–111)
Creatinine, Ser: 0.57 mg/dL (ref 0.44–1.00)
GFR calc Af Amer: >60 mL/min (ref >60)
GFR calc non Af Amer: >60 mL/min (ref >60)
GLUCOSE: 91 mg/dL (ref 65–99)
POTASSIUM: 4.2 mmol/L (ref 3.5–5.1)
SODIUM: 133 mmol/L — AB (ref 135–145)
TOTAL PROTEIN: 8.2 g/dL — AB (ref 6.5–8.1)

## 2017-10-22 LAB — URINALYSIS, ROUTINE W REFLEX MICROSCOPIC
Bilirubin Urine: NEGATIVE
Glucose, UA: NEGATIVE mg/dL
Ketones, ur: NEGATIVE mg/dL
Leukocytes, UA: NEGATIVE
Nitrite: NEGATIVE
Protein, ur: 100 mg/dL — AB
Specific Gravity, Urine: 1.018 (ref 1.005–1.030)
pH: 5 (ref 5.0–8.0)

## 2017-10-22 LAB — POC URINE PREG, ED: Preg Test, Ur: NEGATIVE

## 2017-10-22 LAB — CBC WITH DIFFERENTIAL/PLATELET
BASOS ABS: 0 10*3/uL (ref 0.0–0.1)
BASOS PCT: 0 %
EOS PCT: 1 %
Eosinophils Absolute: 0.1 10*3/uL (ref 0.0–0.7)
HCT: 42.6 % (ref 36.0–46.0)
Hemoglobin: 14.2 g/dL (ref 12.0–15.0)
Lymphocytes Relative: 35 %
Lymphs Abs: 1.9 10*3/uL (ref 0.7–4.0)
MCH: 28.9 pg (ref 26.0–34.0)
MCHC: 33.3 g/dL (ref 30.0–36.0)
MCV: 86.8 fL (ref 78.0–100.0)
MONO ABS: 0.4 10*3/uL (ref 0.1–1.0)
Monocytes Relative: 6 %
Neutro Abs: 3.2 10*3/uL (ref 1.7–7.7)
Neutrophils Relative %: 58 %
PLATELETS: 117 10*3/uL — AB (ref 150–400)
RBC: 4.91 MIL/uL (ref 3.87–5.11)
RDW: 15.4 % (ref 11.5–15.5)
WBC: 5.6 10*3/uL (ref 4.0–10.5)

## 2017-10-22 LAB — ETHANOL: Alcohol, Ethyl (B): 130 mg/dL — ABNORMAL HIGH (ref <10)

## 2017-10-22 LAB — RAPID URINE DRUG SCREEN, HOSP PERFORMED
Amphetamines: NOT DETECTED
Barbiturates: NOT DETECTED
Benzodiazepines: NOT DETECTED
Cocaine: NOT DETECTED
Opiates: NOT DETECTED
Tetrahydrocannabinol: POSITIVE — AB

## 2017-10-22 MED ORDER — SODIUM CHLORIDE 0.9 % IV BOLUS (SEPSIS)
1000.0000 mL | Freq: Once | INTRAVENOUS | Status: AC
  Administered 2017-10-22: 1000 mL via INTRAVENOUS

## 2017-10-22 MED ORDER — ONDANSETRON HCL 4 MG/2ML IJ SOLN
4.0000 mg | Freq: Once | INTRAMUSCULAR | Status: AC
  Administered 2017-10-22: 4 mg via INTRAVENOUS
  Filled 2017-10-22: qty 2

## 2017-10-22 MED ORDER — LORAZEPAM 1 MG PO TABS: 0.0000 mg | ORAL_TABLET | Freq: Two times a day (BID) | ORAL | Status: DC

## 2017-10-22 MED ORDER — LORAZEPAM 2 MG/ML IJ SOLN: 0.0000 mg | Freq: Two times a day (BID) | INTRAMUSCULAR | Status: DC

## 2017-10-22 MED ORDER — ALUM & MAG HYDROXIDE-SIMETH 200-200-20 MG/5ML PO SUSP
30.0000 mL | Freq: Four times a day (QID) | ORAL | Status: DC | PRN
Start: 2017-10-22 — End: 2017-10-23

## 2017-10-22 MED ORDER — THIAMINE HCL 100 MG/ML IJ SOLN: 100.0000 mg | Freq: Every day | INTRAMUSCULAR | Status: DC

## 2017-10-22 MED ORDER — NICOTINE 21 MG/24HR TD PT24: 21.0000 mg | MEDICATED_PATCH | Freq: Every day | TRANSDERMAL | Status: DC

## 2017-10-22 MED ORDER — LORAZEPAM 1 MG PO TABS
1.0000 mg | ORAL_TABLET | Freq: Once | ORAL | Status: DC
  Filled 2017-10-22: qty 1

## 2017-10-22 MED ORDER — VITAMIN B-1 100 MG PO TABS: 100.0000 mg | ORAL_TABLET | Freq: Every day | ORAL | Status: DC

## 2017-10-22 MED ORDER — NICOTINE 21 MG/24HR TD PT24
21.0000 mg | MEDICATED_PATCH | Freq: Every day | TRANSDERMAL | Status: DC
  Administered 2017-10-22: 21 mg via TRANSDERMAL
  Filled 2017-10-22: qty 1

## 2017-10-22 MED ORDER — LORAZEPAM 1 MG PO TABS
0.0000 mg | ORAL_TABLET | Freq: Four times a day (QID) | ORAL | Status: DC
  Administered 2017-10-23: 1 mg via ORAL
  Filled 2017-10-22: qty 1

## 2017-10-22 MED ORDER — ACETAMINOPHEN 325 MG PO TABS: 650.0000 mg | ORAL_TABLET | ORAL | Status: DC | PRN

## 2017-10-22 MED ORDER — LORAZEPAM 2 MG/ML IJ SOLN
0.0000 mg | Freq: Four times a day (QID) | INTRAMUSCULAR | Status: DC
Start: 2017-10-22 — End: 2017-10-23
  Administered 2017-10-23: 2 mg via INTRAVENOUS
  Filled 2017-10-22: qty 1

## 2017-10-22 MED ORDER — LORAZEPAM 2 MG/ML IJ SOLN
1.0000 mg | Freq: Once | INTRAMUSCULAR | Status: AC
Start: 2017-10-22 — End: 2017-10-22
  Administered 2017-10-22: 1 mg via INTRAVENOUS
  Filled 2017-10-22: qty 1

## 2017-10-22 MED ORDER — LORAZEPAM 2 MG/ML IJ SOLN
1.0000 mg | Freq: Once | INTRAMUSCULAR | Status: AC
  Administered 2017-10-22: 1 mg via INTRAVENOUS
  Filled 2017-10-22: qty 1

## 2017-10-22 MED ORDER — ONDANSETRON HCL 4 MG PO TABS
4.0000 mg | ORAL_TABLET | Freq: Three times a day (TID) | ORAL | Status: DC | PRN
  Administered 2017-10-23: 4 mg via ORAL
  Filled 2017-10-22: qty 1

## 2017-10-22 MED ORDER — MECLIZINE HCL 12.5 MG PO TABS
12.5000 mg | ORAL_TABLET | Freq: Once | ORAL | Status: AC
  Administered 2017-10-22: 12.5 mg via ORAL
  Filled 2017-10-22: qty 1

## 2017-10-22 NOTE — ED Provider Notes (Signed)
[facility] EMERGENCY DEPARTMENT Provider Note   CSN: [id number] Arrival date & time: 10/22/17  1647     History   Chief Complaint Chief Complaint  Patient presents with  . Medical Clearance    HPI Jill Peters is a 36 y.o. female.   Mental Health Problem  Presenting symptoms: bizarre behavior, depression, disorganized speech, hallucinations, self-mutilation and suicidal thoughts   Presenting symptoms: no homicidal ideas   Patient accompanied by:  Family member Degree of incapacity (severity):  Moderate Onset quality:  Gradual Duration:  2 months Timing:  Constant Progression:  Worsening Chronicity:  Recurrent Context: alcohol use and noncompliance   Context: not drug abuse   Treatment compliance:  Untreated Time since last psychoactive medication taken:  2 months Relieved by:  None tried Worsened by:  Alcohol Ineffective treatments:  None tried Associated symptoms: anxiety, appetite change, decreased need for sleep, feelings of worthlessness, irritability and weight change   Risk factors: hx of mental illness     Past Medical History:  Diagnosis Date  . Anxiety   . Bipolar 1 disorder (HCC)   . Hidradenitis suppurativa   . Hypertension   . Obesity     Patient Active Problem List   Diagnosis Date Noted  . Major depression 07/29/2013  . Opioid dependence (HCC) 07/24/2013  . Benzodiazepine dependence (HCC) 07/24/2013  . Pyelonephritis 01/20/2013  . SIRS (systemic inflammatory response syndrome) (HCC) 01/20/2013  . Hyponatremia 01/20/2013  . Lumbar back pain 08/12/2011  . GERD (gastroesophageal reflux disease) 07/17/2011  . Hypertension 07/17/2011  . Hidradenitis suppurativa 07/17/2011    Past Surgical History:  Procedure Laterality Date  . CHOLECYSTECTOMY    . CYSTECTOMY  2005    removed from tail bone  . WISDOM TOOTH EXTRACTION  age 36    OB History    Gravida Para Term Preterm AB Living   1 0 0 0 1 0   SAB TAB Ectopic Multiple Live Births    1 0 0 0 0       Home Medications    Prior to Admission medications   Medication Sig Start Date End Date Taking? Authorizing Provider  escitalopram (LEXAPRO) 10 MG tablet Take 10 mg by mouth daily.    [provider]  ibuprofen (ADVIL,MOTRIN) 200 MG tablet Take 800 mg by mouth every 8 (eight) hours as needed.    [provider]    Family History Family History  Problem Relation Age of Onset  . Arthritis Mother   . Leukemia Mother   . Hypertension Father   . Alcohol abuse Father   . Bipolar disorder Father   . Arthritis Maternal Grandmother   . Hypertension Maternal Grandmother   . Alcohol abuse Cousin   . Alcohol abuse Brother        1/2 brother  . Alcohol abuse Brother        1/2 brother    Social History Social History   Tobacco Use  . Smoking status: Current Every Day Smoker    Packs/day: 0.50    Years: 10.00    Pack years: 5.00    Types: Cigarettes  . Smokeless tobacco: Never Used  Substance Use Topics  . Alcohol use: Yes    Comment: every day-beer  . Drug use: Yes    Types: Hydrocodone, Marijuana    Comment: last used today     Allergies   Penicillins; Amoxicillin; and Naproxen   Review of Systems Review of Systems  Constitutional: Positive for appetite change  and irritability.  HENT: Positive for congestion.   Neurological: Positive for dizziness.       Difficulty hearing out of right ear for 8 months  Psychiatric/Behavioral: Positive for hallucinations, self-injury and suicidal ideas. Negative for homicidal ideas. The patient is nervous/anxious.   All other systems reviewed and are negative.    Physical Exam Updated Vital Signs BP 120/83 (BP Location: Left Arm)   Pulse (!) 112   Temp 98.3 F (36.8 C) (Oral)   Resp 19   Ht 5\' 3"  (1.6 m)   Wt 62.6 kg (138 lb)   LMP 10/01/2017   SpO2 97%   BMI 24.45 kg/m   Physical Exam  Constitutional: She is oriented to person, place, and time. She appears well-developed and  well-nourished.  HENT:  Head: Normocephalic and atraumatic.  Eyes: Conjunctivae and EOM are normal.  Neck: Normal range of motion.  Cardiovascular: Normal rate and regular rhythm.  Pulmonary/Chest: No stridor. No respiratory distress.  Abdominal: Soft. Bowel sounds are normal. She exhibits no distension.  Neurological: She is alert and oriented to person, place, and time. No cranial nerve deficit. Coordination normal.  Skin: Skin is warm and dry.  Multiple superficial wounds to left forearm  Psychiatric: Her mood appears anxious. Her speech is rapid and/or pressured. She is agitated. She expresses suicidal ideation. She expresses no homicidal ideation. She expresses no suicidal plans and no homicidal plans. She is noncommunicative.  Nursing note and vitals reviewed.    ED Treatments / Results  Labs (all labs ordered are listed, but only abnormal results are displayed) Labs Reviewed  RAPID URINE DRUG SCREEN, HOSP PERFORMED - Abnormal; Notable for the following components:      Result Value   Tetrahydrocannabinol POSITIVE (*)    All other components within normal limits  URINALYSIS, ROUTINE W REFLEX MICROSCOPIC - Abnormal; Notable for the following components:   APPearance HAZY (*)    Hgb urine dipstick SMALL (*)    Protein, ur 100 (*)    Bacteria, UA RARE (*)    Squamous Epithelial / LPF 0-5 (*)    All other components within normal limits  COMPREHENSIVE METABOLIC PANEL  ETHANOL  CBC WITH DIFFERENTIAL/PLATELET  POC URINE PREG, ED    EKG  EKG Interpretation  Date/Time:  Wednesday October 22 2017 18:25:50 EST Ventricular Rate:  101 PR Interval:    QRS Duration: 106 QT Interval:  353 QTC Calculation: 458 R Axis:   83 Text Interpretation:  Sinus tachycardia RSR' in V1 or V2, right VCD or RVH tachy new, otherwise similr to previous Confirmed by Marily MemosMesner, Jenee Spaugh [id number]) on 10/22/2017 6:37:28 PM       Radiology Ct Head Wo Contrast  Result Date: 10/22/2017 CLINICAL DATA:   Headache, dizziness EXAM: CT HEAD WITHOUT CONTRAST TECHNIQUE: Contiguous axial images were obtained from the base of the skull through the vertex without intravenous contrast. COMPARISON:  None. FINDINGS: Brain: No acute intracranial abnormality. Specifically, no hemorrhage, hydrocephalus, mass lesion, acute infarction, or significant intracranial injury. Vascular: No hyperdense vessel or unexpected calcification. Skull: No acute calvarial abnormality. Sinuses/Orbits: Visualized paranasal sinuses and mastoids clear. Orbital soft tissues unremarkable. Other: None IMPRESSION: Normal study. Electronically Signed   By: Charlett NoseKevin  Dover M.D.   On: 10/22/2017 18:39    Procedures Procedures (including critical care time)  Medications Ordered in ED Medications  meclizine (ANTIVERT) tablet 12.5 mg (12.5 mg Oral Given 10/22/17 1822)  sodium chloride 0.9 % bolus 1,000 mL (1,000 mLs Intravenous New Bag/Given 10/22/17 1822)  LORazepam (ATIVAN) injection 1 mg (1 mg Intravenous Given 10/22/17 1821)     Initial Impression / Assessment and Plan / ED Course  I have reviewed the triage vital signs and the nursing notes.  Pertinent labs & imaging results that were available during my care of the patient were reviewed by me and considered in my medical decision making (see chart for details).    Has tachycardia, decreased UOP, suspect dehydration as cause or at least exacerbating factor for her dizziness/vertigo, will hydrate. Will also check CT head/labs/urine to medically clear but ultimately needs ENT follow up for dizziness/hearing loss.   Also is suicidal, self mutilating, hallucinating, paranoid, depressed so will need TTS once medically cleared.   Workup unremarkable aside from mild dehydration. After fluids and anti-anxiety medications, patient HR significantly improved.   Medically stable and cleared for TTS consultation for suicidal thoughts and depression with hallucinations and alcohol abuse. No IVC at  this time.   Final Clinical Impressions(s) / ED Diagnoses   Final diagnoses:  Dehydration  Depression, unspecified depression type      Marily Memos, MD 10/23/17 0006

## 2017-10-22 NOTE — ED Triage Notes (Addendum)
Pt states "I need mental help, Im bipolar and I dont have any mental clarity" pt states she is si.. Pt with friend. Nad. States hasnt been on meds in months. Denies hi. Pt states has been talking to people that are dead. Pt denies plan. Pt appears agitated. wanded in triage

## 2017-10-23 ENCOUNTER — Inpatient Hospital Stay (HOSPITAL_COMMUNITY)
Admission: AD | Admit: 2017-10-23 | Discharge: 2017-10-27 | DRG: 885 | Disposition: A | Discharge status: Home / Self Care | Payer: 59 | Source: Intra-hospital | Attending: Psychiatry | Admitting: Psychiatry

## 2017-10-23 ENCOUNTER — Encounter (HOSPITAL_COMMUNITY): Payer: Self-pay | Admitting: Behavioral Health

## 2017-10-23 DIAGNOSIS — R45851 Suicidal ideations: Secondary | ICD-10-CM | POA: Diagnosis present

## 2017-10-23 DIAGNOSIS — F419 Anxiety disorder, unspecified: Secondary | ICD-10-CM | POA: Diagnosis present

## 2017-10-23 DIAGNOSIS — F319 Bipolar disorder, unspecified: Principal | ICD-10-CM | POA: Diagnosis present

## 2017-10-23 DIAGNOSIS — Z23 Encounter for immunization: Secondary | ICD-10-CM | POA: Diagnosis not present

## 2017-10-23 DIAGNOSIS — F1210 Cannabis abuse, uncomplicated: Secondary | ICD-10-CM | POA: Diagnosis not present

## 2017-10-23 DIAGNOSIS — G4700 Insomnia, unspecified: Secondary | ICD-10-CM | POA: Diagnosis present

## 2017-10-23 DIAGNOSIS — Z880 Allergy status to penicillin: Secondary | ICD-10-CM

## 2017-10-23 DIAGNOSIS — F1020 Alcohol dependence, uncomplicated: Secondary | ICD-10-CM

## 2017-10-23 DIAGNOSIS — Z886 Allergy status to analgesic agent status: Secondary | ICD-10-CM | POA: Diagnosis not present

## 2017-10-23 DIAGNOSIS — R443 Hallucinations, unspecified: Secondary | ICD-10-CM | POA: Diagnosis present

## 2017-10-23 DIAGNOSIS — F329 Major depressive disorder, single episode, unspecified: Secondary | ICD-10-CM | POA: Diagnosis present

## 2017-10-23 DIAGNOSIS — F17210 Nicotine dependence, cigarettes, uncomplicated: Secondary | ICD-10-CM | POA: Diagnosis present

## 2017-10-23 DIAGNOSIS — Z811 Family history of alcohol abuse and dependence: Secondary | ICD-10-CM

## 2017-10-23 DIAGNOSIS — F1290 Cannabis use, unspecified, uncomplicated: Secondary | ICD-10-CM | POA: Diagnosis present

## 2017-10-23 DIAGNOSIS — F10239 Alcohol dependence with withdrawal, unspecified: Secondary | ICD-10-CM | POA: Diagnosis present

## 2017-10-23 DIAGNOSIS — F332 Major depressive disorder, recurrent severe without psychotic features: Secondary | ICD-10-CM | POA: Diagnosis not present

## 2017-10-23 DIAGNOSIS — F410 Panic disorder [episodic paroxysmal anxiety] without agoraphobia: Secondary | ICD-10-CM | POA: Diagnosis not present

## 2017-10-23 DIAGNOSIS — F3111 Bipolar disorder, current episode manic without psychotic features, mild: Secondary | ICD-10-CM

## 2017-10-23 DIAGNOSIS — F3130 Bipolar disorder, current episode depressed, mild or moderate severity, unspecified: Secondary | ICD-10-CM | POA: Diagnosis not present

## 2017-10-23 DIAGNOSIS — I10 Essential (primary) hypertension: Secondary | ICD-10-CM | POA: Diagnosis present

## 2017-10-23 DIAGNOSIS — Z818 Family history of other mental and behavioral disorders: Secondary | ICD-10-CM | POA: Diagnosis not present

## 2017-10-23 DIAGNOSIS — R450 Nervousness: Secondary | ICD-10-CM | POA: Diagnosis not present

## 2017-10-23 HISTORY — DX: Major depressive disorder, single episode, unspecified: F32.9

## 2017-10-23 MED ORDER — LOPERAMIDE HCL 2 MG PO CAPS: 2.0000 mg | ORAL_CAPSULE | ORAL | Status: AC | PRN

## 2017-10-23 MED ORDER — ACETAMINOPHEN 325 MG PO TABS
650.0000 mg | ORAL_TABLET | Freq: Four times a day (QID) | ORAL | Status: DC | PRN
  Administered 2017-10-24 – 2017-10-27 (×2): 650 mg via ORAL
  Filled 2017-10-23 (×2): qty 2

## 2017-10-23 MED ORDER — ONDANSETRON 4 MG PO TBDP
4.0000 mg | ORAL_TABLET | Freq: Four times a day (QID) | ORAL | Status: AC | PRN
  Administered 2017-10-23 – 2017-10-26 (×3): 4 mg via ORAL
  Filled 2017-10-23 (×3): qty 1

## 2017-10-23 MED ORDER — NICOTINE 21 MG/24HR TD PT24
21.0000 mg | MEDICATED_PATCH | Freq: Every day | TRANSDERMAL | Status: DC
  Administered 2017-10-24 – 2017-10-27 (×4): 21 mg via TRANSDERMAL
  Filled 2017-10-23 (×7): qty 1

## 2017-10-23 MED ORDER — CHLORDIAZEPOXIDE HCL 25 MG PO CAPS
25.0000 mg | ORAL_CAPSULE | ORAL | Status: AC
  Administered 2017-10-26 (×2): 25 mg via ORAL
  Filled 2017-10-23 (×2): qty 1

## 2017-10-23 MED ORDER — MAGNESIUM HYDROXIDE 400 MG/5ML PO SUSP: 30.0000 mL | Freq: Every day | ORAL | Status: DC | PRN

## 2017-10-23 MED ORDER — ADULT MULTIVITAMIN W/MINERALS CH
1.0000 | ORAL_TABLET | Freq: Every day | ORAL | Status: DC
  Administered 2017-10-23 – 2017-10-27 (×5): 1 via ORAL
  Filled 2017-10-23 (×7): qty 1

## 2017-10-23 MED ORDER — CHLORDIAZEPOXIDE HCL 25 MG PO CAPS
25.0000 mg | ORAL_CAPSULE | Freq: Three times a day (TID) | ORAL | Status: AC
  Administered 2017-10-25 (×3): 25 mg via ORAL
  Filled 2017-10-23 (×4): qty 1

## 2017-10-23 MED ORDER — CHLORDIAZEPOXIDE HCL 25 MG PO CAPS
25.0000 mg | ORAL_CAPSULE | Freq: Four times a day (QID) | ORAL | Status: AC
  Administered 2017-10-23 – 2017-10-24 (×6): 25 mg via ORAL
  Filled 2017-10-23 (×5): qty 1

## 2017-10-23 MED ORDER — INFLUENZA VAC SPLIT QUAD 0.5 ML IM SUSY
0.5000 mL | PREFILLED_SYRINGE | INTRAMUSCULAR | Status: AC
  Administered 2017-10-26: 0.5 mL via INTRAMUSCULAR
  Filled 2017-10-23: qty 0.5

## 2017-10-23 MED ORDER — CHLORDIAZEPOXIDE HCL 25 MG PO CAPS
25.0000 mg | ORAL_CAPSULE | Freq: Four times a day (QID) | ORAL | Status: AC | PRN
  Administered 2017-10-25: 25 mg via ORAL
  Filled 2017-10-23 (×2): qty 1

## 2017-10-23 MED ORDER — PNEUMOCOCCAL VAC POLYVALENT 25 MCG/0.5ML IJ INJ
0.5000 mL | INJECTION | INTRAMUSCULAR | Status: AC
  Administered 2017-10-26: 0.5 mL via INTRAMUSCULAR

## 2017-10-23 MED ORDER — HYDROXYZINE HCL 25 MG PO TABS
25.0000 mg | ORAL_TABLET | Freq: Four times a day (QID) | ORAL | Status: AC | PRN
  Administered 2017-10-23 – 2017-10-26 (×4): 25 mg via ORAL
  Filled 2017-10-23 (×3): qty 1
  Filled 2017-10-23: qty 3
  Filled 2017-10-23: qty 1

## 2017-10-23 MED ORDER — CHLORDIAZEPOXIDE HCL 25 MG PO CAPS
25.0000 mg | ORAL_CAPSULE | Freq: Every day | ORAL | Status: AC
  Administered 2017-10-27: 25 mg via ORAL
  Filled 2017-10-23: qty 1

## 2017-10-23 MED ORDER — VITAMIN B-1 100 MG PO TABS
100.0000 mg | ORAL_TABLET | Freq: Every day | ORAL | Status: DC
  Administered 2017-10-24 – 2017-10-27 (×4): 100 mg via ORAL
  Filled 2017-10-23 (×6): qty 1

## 2017-10-23 MED ORDER — ALUM & MAG HYDROXIDE-SIMETH 200-200-20 MG/5ML PO SUSP: 30.0000 mL | ORAL | Status: DC | PRN

## 2017-10-23 NOTE — ED Provider Notes (Signed)
02:15 AM TTS called, patient meets inpatient admission criteria for dual diagnosis, [facility], however, will also look for other placement   Devoria AlbeKnapp, Shrey Boike, MD 10/23/17 [phone]

## 2017-10-23 NOTE — Tx Team (Signed)
Initial Treatment Plan 10/23/2017 2:36 PM Jill Peters QMV:[id number]    PATIENT STRESSORS: Medication change or noncompliance Substance abuse   PATIENT STRENGTHS: Ability for insight Capable of independent living   PATIENT IDENTIFIED PROBLEMS: "Stabilize depression"  "Stabilize anxiety"                   DISCHARGE CRITERIA:  Ability to meet basic life and health needs Adequate post-discharge living arrangements  PRELIMINARY DISCHARGE PLAN: Attend aftercare/continuing care group Attend PHP/IOP  PATIENT/FAMILY INVOLVEMENT: This treatment plan has been presented to and reviewed with the patient, Jill Peters, and/or family member.  The patient and family have been given the opportunity to ask questions and make suggestions.  Layla BarterWhite, Cassi Jenne L, RN 10/23/2017, 2:36 PM

## 2017-10-23 NOTE — Progress Notes (Signed)
D    Pt is pleasant on approach and is appropriate in his behavior   She interacts well with others and is compliant with treatment    She requested sleep medications   She has been visible on the unit and attends group this evening A   Verbal support given   Medications administered and effectiveness monitored   Q 15 min checks    Discussed withdrawal symptoms and medications to decrease symptoms R   Pt is safe at present time

## 2017-10-23 NOTE — BH Assessment (Signed)
Tele Assessment Note   Patient Name: Jill BossJennifer L Linder MRN: [id number] Referring Physician: Dr. Marily MemosJason Mesner, MD [facility] Location of Provider: Behavioral Health TTS Department  Jill BossJennifer L Peters is an 36 y.o. married female who [facility] seeking treatment for her manic episode.  Pt stated "I guess I had a mental break down".  Pt reports her "mental health has deteriorated since I stopped taking my medication 6 months ago and started self medicating with alcohol and marijuana."  Pt reports drinking a 1 six pack of beer 4-5 days a week and  Smoking 3-4 bowl size of cannabis daily.  Pt last used cannabis and alcohol prior to arrival.  Pt reports receiving [facility] treatment in 2014 for opioid use. Pt denies misusing any other substances other than prescribed.  Pt report having A/V hallucinations of deceased loved ones.  Pt reports getting 2-3 hours of sleep per night for the past 2-3 weeks. Pt denies having HI.  Pt denies having SI but makes passive suicidal statements such as "I don't care if something happens to me or not."  Pt contracts for safety and states she "wants to get better".  Pt reports being married for 14 years with no children.  Pt reports she can return to her home at discharge.  Pt completed high school and some college.  Pt does not work outside of the home.  Pt reports receiving prior psych medications from her PCP provider for anxiety and depression.   Patient was wearing scrubs and appeared appropriately groomed.  Pt was alert throughout the assessment.  Patient made fair eye contact and had abnormal psychomotor activity as evident of pt picking her arm.  Patient spoke in a soft voice without pressured speech.  Pt expressed feeling anxious and depressed.  Pt's affect appeared  Dysphoric/depressed and somewhat irritated and congruent with stated mood. Pt's thought process was logical and coherent.Marland Kitchen.  Pt presented with  fair insight and partial judgement recognizing she needed treatment but not acknowledging she needed treatment for substance use in the form of "self medicating".  Pt did not appear to be responding to internal stimuli during the assessment  Disposition: [facility], Donell SievertSpencer Simon, PA who recommends inpatient treatment for dual diagnosis.  LPCA [facility], Tori, RN who [facility]; TTS will look for placement accordingly.  LPCA informed [facility] provider, Dr. Lynelle DoctorKnapp and pt's nurse, Casimiro NeedleMichael, RN of the PA's recommended disposition and the bed availability situation  Diagnosis: Major Depressive Disorder,Severe with Mood-congruent Psychotic features; Bipolar II Disorder, with manic features; Alcohol Use Disorder, Moderate; Cannabis Use Disorder, Moderate; and Social Anxiety Disorder  Past Medical History:  Past Medical History:  Diagnosis Date  . Anxiety   . Bipolar 1 disorder (HCC)   . Hidradenitis suppurativa   . Hypertension   . Obesity     Past Surgical History:  Procedure Laterality Date  . CHOLECYSTECTOMY    . CYSTECTOMY  2005    removed from tail bone  . WISDOM TOOTH EXTRACTION  age 36    Family History:  Family History  Problem Relation Age of Onset  . Arthritis Mother   . Leukemia Mother   . Hypertension Father   . Alcohol abuse Father   . Bipolar disorder Father   . Arthritis Maternal Grandmother   . Hypertension Maternal Grandmother   . Alcohol abuse Cousin   .  Alcohol abuse Brother        1/2 brother  . Alcohol abuse Brother        1/2 brother    Social History:  reports that she has been smoking cigarettes.  She has a 5.00 pack-year smoking history. she has never used smokeless tobacco. She reports that she drinks alcohol. She reports that she uses drugs. Drugs: Hydrocodone and Marijuana.  Additional Social History:  Alcohol / Drug Use Pain Medications: See MARs Prescriptions: See  MARs Over the Counter: See MARs History of alcohol / drug use?: Yes Longest period of sobriety (when/how long): unknown Negative Consequences of Use: Personal relationships Substance #1 Name of Substance 1: Alcohol 1 - Age of First Use: 36  yrs old 1 - Amount (size/oz): 1 six pack of beer 1 - Frequency: 4-5 times per week 1 - Duration: pt reports self medicating for 6 months 1 - Last Use / Amount: PTA Substance #2 Name of Substance 2: Marijuana 2 - Age of First Use: 36 y/o 2 - Amount (size/oz): 2-3 bowl packs full 2 - Frequency: daily 2 - Duration: Pt reports self medicating for 6 months 2 - Last Use / Amount: PTA Substance #3 Name of Substance 3: Opioids or pain pills 3 - Age of First Use: unknown 3 - Amount (size/oz): unknown 3 - Frequency: unknown 3 - Duration: unknown 3 - Last Use / Amount: pt reports going through rehab in 2014  CIWA: CIWA-Ar BP: 123/71 Pulse Rate: (!) 105 COWS:    PATIENT STRENGTHS: (choose at least two) Average or above average intelligence Communication skills Motivation for treatment/growth  Allergies:  Allergies  Allergen Reactions  . Penicillins Anaphylaxis    Has patient had a PCN reaction causing immediate rash, facial/tongue/throat swelling, SOB or lightheadedness with hypotension: Yes Has patient had a PCN reaction causing severe rash involving mucus membranes or skin necrosis: Yes Has patient had a PCN reaction that required hospitalization Yes Has patient had a PCN reaction occurring within the last 10 years: Yes If all of the above answers are "NO", then may proceed with Cephalosporin use.   Marland Kitchen Amoxicillin   . Naproxen Nausea Only    Home Medications:  (Not in a hospital admission)  OB/GYN Status:  Patient's last menstrual period was 10/01/2017.  General Assessment Data Location of Assessment: [facility] TTS Assessment: In system Is this a Tele or Face-to-Face Assessment?: Tele Assessment Is this an Initial Assessment or a  Re-assessment for this encounter?: Initial Assessment Marital status: Married [place] name: Eliberto Ivory Is patient pregnant?: Unknown Pregnancy Status: Unknown Living Arrangements: Spouse/significant other(Pt can return at discharge) Can pt return to current living arrangement?: Yes Admission Status: Voluntary Is patient capable of signing voluntary admission?: Yes Referral Source: Self/Family/Friend Insurance type: [employer]     Crisis Care Plan Living Arrangements: Spouse/significant other(Pt can return at discharge) Legal Guardian: Other:(Self)  Education Status Is patient currently in school?: No Highest grade of school patient has completed: 12th grade and some college  Risk to self with the past 6 months Suicidal Ideation: Yes-Currently Present(pt reports having passive suicidal thoughts ) Has patient been a risk to self within the past 6 months prior to admission? : No Suicidal Intent: No Has patient had any suicidal intent within the past 6 months prior to admission? : No Is patient at risk for suicide?: Yes Suicidal Plan?: No Has patient had any suicidal plan within the past 6 months prior to admission? : No Access to Means: No(pt denies  having weapons in her home) What has been your use of drugs/alcohol within the last 12 months?: Marijuan and Alcohol daily Previous Attempts/Gestures: No Other Self Harm Risks: pt report cutting for past 3 months Triggers for Past Attempts: Hallucinations Intentional Self Injurious Behavior: Cutting(2-3 months of cutting 2-3 times) Comment - Self Injurious Behavior: pt reports cutting 2-3 times in past 2-3 months Family Suicide History: Unknown Persecutory voices/beliefs?: Yes Depression: Yes Depression Symptoms: Tearfulness, Isolating, Loss of interest in usual pleasures, Feeling worthless/self pity, Feeling angry/irritable Substance abuse history and/or treatment for substance abuse?: Yes Suicide prevention information given to  non-admitted patients: Not applicable  Risk to Others within the past 6 months Homicidal Ideation: No Does patient have any lifetime risk of violence toward others beyond the six months prior to admission? : No Thoughts of Harm to Others: No Current Homicidal Intent: No Current Homicidal Plan: No Access to Homicidal Means: No History of harm to others?: No Assessment of Violence: None Noted Does patient have access to weapons?: No Criminal Charges Pending?: No Does patient have a court date: No Is patient on probation?: No  Psychosis Hallucinations: Auditory, Visual Delusions: Unspecified  Mental Status Report Appearance/Hygiene: In scrubs Eye Contact: Fair Motor Activity: Restlessness, Agitation Speech: Soft, Pressured Level of Consciousness: Alert Mood: Depressed, Anxious, Suspicious, Helpless, Irritable Affect: Anxious, Apprehensive, Depressed, Fearful, Irritable, Sad Anxiety Level: Moderate Judgement: Partial Orientation: Person, Place, Time, Situation, Appropriate for developmental age Obsessive Compulsive Thoughts/Behaviors: None  Cognitive Functioning Concentration: Normal Memory: Recent Intact, Remote Intact IQ: Average Insight: Poor Impulse Control: Poor Appetite: Fair Sleep: Decreased Total Hours of Sleep: 3 Vegetative Symptoms: None  ADLScreening [facility]) Patient's cognitive ability adequate to safely complete daily activities?: Yes Patient able to express need for assistance with ADLs?: Yes Independently performs ADLs?: Yes (appropriate for developmental age)  Prior Inpatient Therapy Prior Inpatient Therapy: No Prior Therapy Dates:   Prior Therapy Facilty/Provider(s):   Reason for Treatment:    Prior Outpatient Therapy Prior Outpatient Therapy: Yes Prior Therapy Dates: 2014 Prior [place] [facility] [place] Reason for Treatment: Substance abuse (pain meds) Does patient have an ACCT team?: No Does patient  have Intensive In-House Services?  : No Does patient have [facility] services? : No Does patient have P4CC services?: No  ADL Screening (condition at time of admission) Patient's cognitive ability adequate to safely complete daily activities?: Yes Is the patient deaf or have difficulty hearing?: No Does the patient have difficulty seeing, even when wearing glasses/contacts?: No Does the patient have difficulty concentrating, remembering, or making decisions?: No Patient able to express need for assistance with ADLs?: Yes Does the patient have difficulty dressing or bathing?: No Independently performs ADLs?: Yes (appropriate for developmental age) Does the patient have difficulty walking or climbing stairs?: No Weakness of Legs: None Weakness of Arms/Hands: None  Home Assistive Devices/Equipment Home Assistive Devices/Equipment: None    Abuse/Neglect Assessment (Assessment to be complete while patient is alone) Abuse/Neglect Assessment Can Be Completed: Yes Physical Abuse: Denies Verbal Abuse: Yes, past (Comment) Sexual Abuse: Denies Exploitation of patient/patient's resources: Denies Self-Neglect: Denies Values / Beliefs Cultural Requests During Hospitalization: None Spiritual Requests During Hospitalization: None   Advance Directives (For Healthcare) Does Patient Have a Medical Advance Directive?: No Would patient like information on creating a medical advance directive?: No - Patient declined    Additional Information 1:1 In Past 12 Months?: No CIRT Risk: No Elopement Risk: No Does patient have medical clearance?: Yes     Disposition: Case discussed  with [facility] provider, Donell Sievert, PA who recommends inpatient treatment for dual diagnosis.  LPCA followed up with [facility] AC, Tori, RN who states pt is accepted into [facility] pending bed availability; TTS will look for placement accordingly.  LPCA informed [facility] provider, Dr. Lynelle Doctor and pt's nurse, Casimiro Needle, RN of the PA's  recommended disposition and the bed availability situation.  Disposition Initial Assessment Completed for this Encounter: Yes Disposition of Patient: Inpatient treatment program(Per Donell Sievert, PA) Type of inpatient treatment program: Adult(Dual Diagnosis)  This service was provided via telemedicine using a 2-way, interactive audio and video technology.  Names of all persons participating in this telemedicine service and their role in this encounter.               Lillyan Hitson L Nkechi Linehan, MS, LPCA, NCC 10/23/2017 2:07 AM

## 2017-10-23 NOTE — ED Notes (Signed)
[facility]

## 2017-10-23 NOTE — Progress Notes (Signed)
Admission note: Pt presented to [facility] adult [facility] vol from [facility]. Pt presents with a flat affect and a depressed/anxious mood. Pt verbalized that she's seeking help for alcohol abuse. Pt reported drinking a 12 pack of beer daily. Pt has been off her meds for 6 months because she didn't like the way they made her feel. Pt reports increased depression and anxiety. Pt reports passive SI with no plan or intent. Pt reports AVH. Pt reported hearing the voice and seeing images of her deceased parents. Pt requested to get started on meds to stabilize her depression and anxiety. Pt is currently denying any withdrawal symptoms at this time.  Pt noted to have self inflicted cuts to her left forearm.

## 2017-10-23 NOTE — ED Notes (Signed)
Pt states she feels more depressed and anxious this morning than she was last night.  Tearful and anxious at this time.

## 2017-10-23 NOTE — ED Notes (Signed)
Patient sitting up in bed crying at this time. States that she is having some anxiety at this time. Asking if she can have something to help calm her nerves. Spoke with her RN Casimiro NeedleMichael about medication.

## 2017-10-23 NOTE — Progress Notes (Signed)
Per Berneice Heinrichina Tate , [facility], [facility] ; Accepting provider is Donell SievertSpencer Simon, NP; Attending provider is Dr. Jama Flavorsobos.  Patient can arrive now, the bed is ready. Number for report is [phone].   Jacki ConesLaurie, RN notified and agreed to inform the patient's assigned RN and ED Provider.    Baldo DaubJolan Jeromie Gainor, MSW, LCSWA Clinical Social Worker (Disposition) [facility]  [phone]

## 2017-10-23 NOTE — ED Provider Notes (Signed)
[facility] Will [facility] stable.    Samuel JesterMcManus, Kalden Wanke, DO 10/23/17 [phone]

## 2017-10-23 NOTE — [facility] Group Notes (Signed)
[facility] 10/23/2017 1:15pm  Type of Therapy: Mental Health Association Presentation  Participation Level: Active  Participation Quality: Attentive  Affect: Appropriate  Cognitive: Oriented  Insight: Developing/Improving  Engagement in Therapy: Engaged  Modes of Intervention: Discussion, Education and Socialization  Summary of Progress/Problems: Mental Health Association (MHA) Speaker came to talk about his personal journey with mental health. The pt processed ways by which to relate to the speaker. MHA speaker provided handouts and educational information pertaining to groups and services offered by the [facility]. Pt was engaged in speaker's presentation and was receptive to resources provided.    Pulte HomesHeather N Smart, LCSW 10/23/2017 2:54 PM

## 2017-10-23 NOTE — Progress Notes (Signed)
[facility] Group Notes:  (Nursing/MHT/Case Management/Adjunct)  Date:  10/23/2017  Time:  2100 Type of Therapy:  wrap up group  Participation Level:  Active  Participation Quality:  Appropriate, Attentive, Sharing and Supportive  Affect:  Depressed  Cognitive:  Appropriate  Insight:  Improving  Engagement in Group:  Engaged  Modes of Intervention:  Clarification, Education and Support  Summary of Progress/Problems:  Marcille BuffyMcNeil, Quill Grinder S 10/23/2017, 9:43 PM

## 2017-10-24 DIAGNOSIS — Z818 Family history of other mental and behavioral disorders: Secondary | ICD-10-CM

## 2017-10-24 DIAGNOSIS — F17210 Nicotine dependence, cigarettes, uncomplicated: Secondary | ICD-10-CM

## 2017-10-24 DIAGNOSIS — R450 Nervousness: Secondary | ICD-10-CM

## 2017-10-24 DIAGNOSIS — F1020 Alcohol dependence, uncomplicated: Secondary | ICD-10-CM

## 2017-10-24 DIAGNOSIS — G4700 Insomnia, unspecified: Secondary | ICD-10-CM

## 2017-10-24 DIAGNOSIS — F332 Major depressive disorder, recurrent severe without psychotic features: Secondary | ICD-10-CM

## 2017-10-24 DIAGNOSIS — F410 Panic disorder [episodic paroxysmal anxiety] without agoraphobia: Secondary | ICD-10-CM

## 2017-10-24 DIAGNOSIS — F419 Anxiety disorder, unspecified: Secondary | ICD-10-CM

## 2017-10-24 DIAGNOSIS — Z811 Family history of alcohol abuse and dependence: Secondary | ICD-10-CM

## 2017-10-24 MED ORDER — SERTRALINE HCL 50 MG PO TABS
50.0000 mg | ORAL_TABLET | Freq: Every day | ORAL | Status: DC
  Administered 2017-10-24 – 2017-10-27 (×4): 50 mg via ORAL
  Filled 2017-10-24 (×7): qty 1

## 2017-10-24 MED ORDER — ARIPIPRAZOLE 5 MG PO TABS
5.0000 mg | ORAL_TABLET | Freq: Every day | ORAL | Status: DC
  Administered 2017-10-24 – 2017-10-27 (×4): 5 mg via ORAL
  Filled 2017-10-24 (×7): qty 1

## 2017-10-24 MED ORDER — TRAZODONE HCL 50 MG PO TABS
50.0000 mg | ORAL_TABLET | Freq: Every day | ORAL | Status: DC
  Administered 2017-10-24: 50 mg via ORAL
  Filled 2017-10-24 (×3): qty 1

## 2017-10-24 NOTE — Progress Notes (Signed)
NUTRITION ASSESSMENT  Pt identified as at risk on the Malnutrition Screen Tool  INTERVENTION: 1. Educated patient on the importance of nutrition and encouraged intake of food and beverages. 2. Discussed weight goals. 3. Supplements: none at this time.   NUTRITION DIAGNOSIS: Unintentional weight loss related to sub-optimal intake as evidenced by pt report.   Goal: Pt to meet >/= 90% of their estimated nutrition needs.  Monitor:  PO intake  Assessment:  Pt admitted for request for alcohol detox. She drinks a 12 pack of beer each day and has been off of her medications x6 months and has been experiencing increased depression and anxiety.   Per review, pt has lost 7 lbs (5% body weight) in the past 9 months which is not significant for time frame. Continue to encourage PO intakes of meals and snacks.     36 y.o. female  Height: Ht Readings from Last 1 Encounters:  10/23/17 5\' 4"  (1.626 m)    Weight: Wt Readings from Last 1 Encounters:  10/23/17 138 lb (62.6 kg)    Weight Hx: Wt Readings from Last 10 Encounters:  10/23/17 138 lb (62.6 kg)  10/22/17 138 lb (62.6 kg)  01/20/17 145 lb (65.8 kg)  05/16/15 220 lb (99.8 kg)  06/22/13 199 lb (90.3 kg)  06/15/13 207 lb (93.9 kg)  06/05/13 202 lb (91.6 kg)  06/03/13 202 lb (91.6 kg)  04/29/13 207 lb 8 oz (94.1 kg)  04/06/13 212 lb (96.2 kg)    BMI:  Body mass index is 23.69 kg/m. Pt meets criteria for normal weight based on current BMI.  Estimated Nutritional Needs: Kcal: 25-30 kcal/kg Protein: > 1 gram protein/kg Fluid: 1 ml/kcal  Diet Order: Diet regular Room service appropriate? Yes; Fluid consistency: Thin Pt is also offered choice of unit snacks mid-morning and mid-afternoon.  Pt is eating as desired.   Lab results and medications reviewed.      Trenton GammonJessica Graham Hyun, MS, RD, LDN, [facility] Inpatient Clinical Dietitian Pager # [phone] After hours/weekend pager # [phone]

## 2017-10-24 NOTE — Progress Notes (Signed)
Nursing Note 10/24/2017 [id number]  Data Reports sleeping good with PRN sleep med.  Rates depression 8/10, hopelessness 4/10, and anxiety 9/10. Affect depressed.  Denies HI, SI, AVH.  "I don't feel good today, I just woke up feeling depressed and anxious."  Reports hope to speak with doctor today about depression.  Affect brightened up after seeing MD- [facility].  Action Spoke with patient 1:1, nurse offered support to patient throughout shift.  Continues to be monitored on 15 minute checks for safety.  Response Remains safe on unit.

## 2017-10-24 NOTE — Progress Notes (Addendum)
Recreation Therapy Notes  Date: 10/24/17 Time: 0930 Location: 500 Hall Dayroom  Group Topic: Stress Management  Goal Area(s) Addresses:  Patient will verbalize importance of using healthy stress management.  Patient will identify positive emotions associated with healthy stress management.   Behavioral Response: Engaged  Intervention: Stress Management  Activity :  Progressive Muscle Relaxation.  LRT read a script to lead patients through the stress management technique of progressive muscle relaxation.  Patients were to follow along to engage in the activity as LRT read the script.  Education:  Stress Management, Discharge Planning.   Education Outcome: Acknowledges edcuation/In group clarification offered/Needs additional education  Clinical Observations/Feedback: Pt attended group.    Caroll RancherMarjette Santina Trillo, LRT/CTRS     Lillia AbedLindsay, Jaquae Rieves A 10/24/2017 10:59 AM

## 2017-10-24 NOTE — Progress Notes (Signed)
Adult Psychoeducational Group Note  Date:  10/24/2017 Time:  10:27 AM  Group Topic/Focus:  Healthy Communication:   The focus of this group is to discuss communication, barriers to communication, as well as healthy ways to communicate with others.  Participation Level:  Active  Participation Quality:  Appropriate  Affect:  Appropriate  Cognitive:  Appropriate  Insight: Appropriate  Engagement in Group:  Engaged  Modes of Intervention:  Activity  Additional Comments:  Pt participated in all group activities and discussions.  Tashira Torre R Zimere Dunlevy 10/24/2017, 10:27 AM

## 2017-10-24 NOTE — [facility] Suicide Risk Assessment (Signed)
[facility] INPATIENT:  Family/Significant Other Suicide Prevention Education  Suicide Prevention Education:  Contact Attempts: Hessie DibbleJeffery Witman (pt's husband) [phone] has been identified by the patient as the family member/significant other with whom the patient will be residing, and identified as the person(s) who will aid the patient in the event of a mental health crisis.  With written consent from the patient, two attempts were made to provide suicide prevention education, prior to and/or following the patient's discharge.  We were unsuccessful in providing suicide prevention education.  A suicide education pamphlet was given to the patient to share with family/significant other.  Date and time of first attempt: 11:15AM 10/24/17. [id number] left by Baldo DaubJolan Williams LCSW requesting call back at earliest convenience.  Date and time of second attempt: needed  Lew DawesHeather N Smart LCSW 10/24/2017, 1:26 PM

## 2017-10-24 NOTE — Progress Notes (Signed)
The patient attended the evening A.A. Meeting and was appropriate.  

## 2017-10-24 NOTE — Progress Notes (Signed)
D.  Pt pleasant on approach, no complaints voiced at this time.  Pt was positive for evening AA group, observed sitting in dayroom afterwards interacting appropriately with peers on the unit.  Pt denies SI/HI/AVH at this time.  A.  Support and encouragement offered, medication given as ordered  R.  Pt remains safe on the unit, will continue to monitor.

## 2017-10-24 NOTE — [facility] Suicide Risk Assessment (Signed)
[facility]   Nursing information obtained from:  Patient Demographic factors:  Caucasian, Low socioeconomic status Current Mental Status:  Suicidal ideation indicated by patient, Self-harm thoughts, Self-harm behaviors Loss Factors:  Loss of significant relationship Historical Factors:  Family history of mental illness or substance abuse, Impulsivity Risk Reduction Factors:  Sense of responsibility to family, Positive social support  Total Time spent with patient: 45 minutes Principal Problem: MDD (major depressive disorder) Diagnosis:   Patient Active Problem List   Diagnosis Date Noted  . Alcohol use disorder, severe, dependence (HCC) [F10.20] 10/24/2017  . MDD (major depressive disorder) [F32.9] 10/23/2017  . Major depression [F32.9] 07/29/2013  . Opioid dependence (HCC) [F11.20] 07/24/2013  . Benzodiazepine dependence (HCC) [F13.20] 07/24/2013  . Pyelonephritis [N12] 01/20/2013  . SIRS (systemic inflammatory response syndrome) (HCC) [R65.10] 01/20/2013  . Hyponatremia [E87.1] 01/20/2013  . Lumbar back pain [M54.5] 08/12/2011  . GERD (gastroesophageal reflux disease) [K21.9] 07/17/2011  . Hypertension [I10] 07/17/2011  . Hidradenitis suppurativa [L73.2] 07/17/2011   Subjective Data:  Jill Peters is a 36 y/o F with history of MDD  and alcohol use disorder [facility] with worsening depression, self-injurious behaviors, and worsening use of alcohol.   Upon interview, pt explains, "I basically went off my meds about 6 months ago, and I did okay for a while, but then I noticed I started to slip; it was anxiety first and then depression, and then I self-medicated with alcohol, and I felt like I was losing my mind." Pt endorses AH/VH while intoxicated but she otherwise denies AH/VH. She endorses SI before coming in without plan, but she denies current SI/HI. She reports she has been sleeping poorly, and she endorses depressive symptoms of  anhedonia, guilty feelings, low energy, poor concentration, poor appetite, and poor motivation. She denies symptoms of mania, OCD, and TPSD. She reports using cannabis daily as well as drinking about 12 beers per day.  Discussed with patient about treatment options. She is ina greement to be started on previous medication of zoloft, which she notes was helpful for her anxiety and depression in the past. Pt is also in agreement to be started on trial of abilify to supplement the zoloft, and due to concern of family history of bipolar disorder and some symptoms of disorganization and agitation upon arrival which may possibly indicate underlying bipolar disorder, though that is not a clear diagnosis from the presented history.  Continued Clinical Symptoms:  Alcohol Use Disorder Identification Test Final Score (AUDIT): 30 The "Alcohol Use Disorders Identification Test", Guidelines for Use in Primary Care, Second Edition.  World Science writerHealth Organization [facility](WHO). Score between 0-7:  no or low risk or alcohol related problems. Score between 8-15:  moderate risk of alcohol related problems. Score between 16-19:  high risk of alcohol related problems. Score 20 or above:  warrants further diagnostic evaluation for alcohol dependence and treatment.   CLINICAL FACTORS:   Severe Anxiety and/or Agitation Depression:   Comorbid alcohol abuse/dependence Alcohol/Substance Abuse/Dependencies   Musculoskeletal: Strength & Muscle Tone: within normal limits Gait & Station: normal Patient leans: N/A  Psychiatric Specialty Exam: Physical Exam  Nursing note and vitals reviewed.   Review of Systems  Constitutional: Negative for chills and fever.  Respiratory: Negative for cough.   Cardiovascular: Negative for chest pain.  Gastrointestinal: Negative for abdominal pain, heartburn and vomiting.  Psychiatric/Behavioral: Positive for depression, substance abuse and suicidal ideas. Negative for hallucinations. The  patient is nervous/anxious.  Blood pressure (!) 129/101, pulse 97, temperature (!) 97.5 F (36.4 C), temperature source Oral, resp. rate 16, height 5\' 4"  (1.626 m), weight 62.6 kg (138 lb), last menstrual period 10/01/2017.Body mass index is 23.69 kg/m.  General Appearance: Casual and Fairly Groomed  Eye Contact:  Fair  Speech:  Clear and Coherent and Normal Rate  Volume:  Normal  Mood:  Anxious and Depressed  Affect:  Congruent and Constricted  Thought Process:  Coherent and Goal Directed  Orientation:  Full (Time, Place, and Person)  Thought Content:  Logical  Suicidal Thoughts:  No  Homicidal Thoughts:  No  Memory:  Immediate;   Fair Recent;   Fair Remote;   Fair  Judgement:  Intact  Insight:  Lacking  Psychomotor Activity:  Normal  Concentration:  Concentration: Fair  Recall:  [employer]Fair  Fund of Knowledge:  Fair  Language:  NA  Akathisia:  No  Handed:    AIMS (if indicated):     Assets:  Communication Skills Social Support Talents/Skills  ADL's:  Intact  Cognition:  WNL  Sleep:  Number of Hours: 5    COGNITIVE FEATURES THAT CONTRIBUTE TO RISK:  None    SUICIDE RISK:   Minimal: No identifiable suicidal ideation.  Patients presenting with no risk factors but with morbid ruminations; may be classified as minimal risk based on the severity of the depressive symptoms  PLAN OF CARE:   - Admit to inpatient psychiatry unit  - MDD  - Start zoloft 50mg  po qDay  - Start abilify 5mg  po qDay  - Alcohol use disorder with alcohol withdawal  - Stat CIWA protocol with librium  - Anxiety  - start vistaril 25mg  po q6h prn anxiety  - insomnia  - start trazodone 50mg  po qhs prn insomnia  -Encourage participation in groups and the therapeutic milieu -Discharge planning will be ongoing  I certify that inpatient services furnished can reasonably be expected to improve the patient's condition.   Micheal Likenshristopher T Baby Gieger, MD 10/24/2017, 4:15 PM

## 2017-10-24 NOTE — Tx Team (Signed)
Interdisciplinary Treatment and Diagnostic Plan Update  10/24/2017 Time of Session: 0830AM SITLALI KOERNER MRN: [id number]  Principal Diagnosis: MDD  Secondary Diagnoses: Active Problems:   MDD (major depressive disorder)   Current Medications:  Current Facility-Administered Medications  Medication Dose Route Frequency Provider Last Rate Last Dose  . acetaminophen (TYLENOL) tablet 650 mg  650 mg Oral Q6H PRN Rankin, Shuvon B, NP      . alum & mag hydroxide-simeth (MAALOX/MYLANTA) 200-200-20 MG/5ML suspension 30 mL  30 mL Oral Q4H PRN Rankin, Shuvon B, NP      . chlordiazePOXIDE (LIBRIUM) capsule 25 mg  25 mg Oral Q6H PRN Rankin, Shuvon B, NP      . chlordiazePOXIDE (LIBRIUM) capsule 25 mg  25 mg Oral QID Rankin, Shuvon B, NP   25 mg at 10/24/17 0844   Followed by  . [START ON 10/25/2017] chlordiazePOXIDE (LIBRIUM) capsule 25 mg  25 mg Oral TID Rankin, Shuvon B, NP       Followed by  . [START ON 10/26/2017] chlordiazePOXIDE (LIBRIUM) capsule 25 mg  25 mg Oral BH-qamhs Rankin, Shuvon B, NP       Followed by  . [START ON 10/27/2017] chlordiazePOXIDE (LIBRIUM) capsule 25 mg  25 mg Oral Daily Rankin, Shuvon B, NP      . hydrOXYzine (ATARAX/VISTARIL) tablet 25 mg  25 mg Oral Q6H PRN Rankin, Shuvon B, NP   25 mg at 10/23/17 2156  . Influenza vac split quadrivalent PF (FLUARIX) injection 0.5 mL  0.5 mL Intramuscular Tomorrow-1000 Nwoko, Agnes I, NP      . loperamide (IMODIUM) capsule 2-4 mg  2-4 mg Oral PRN Rankin, Shuvon B, NP      . magnesium hydroxide (MILK OF MAGNESIA) suspension 30 mL  30 mL Oral Daily PRN Rankin, Shuvon B, NP      . multivitamin with minerals tablet 1 tablet  1 tablet Oral Daily Rankin, Shuvon B, NP   1 tablet at 10/24/17 0844  . nicotine (NICODERM CQ - dosed in mg/24 hours) patch 21 mg  21 mg Transdermal Daily Lindell Spar I, NP   21 mg at 10/24/17 0844  . ondansetron (ZOFRAN-ODT) disintegrating tablet 4 mg  4 mg Oral Q6H PRN Rankin, Shuvon B, NP   4 mg at 10/23/17  1828  . pneumococcal 23 valent vaccine (PNU-IMMUNE) injection 0.5 mL  0.5 mL Intramuscular Tomorrow-1000 Nwoko, Agnes I, NP      . thiamine (VITAMIN B-1) tablet 100 mg  100 mg Oral Daily Rankin, Shuvon B, NP   100 mg at 10/24/17 0844   PTA Medications: Medications Prior to Admission  Medication Sig Dispense Refill Last Dose  . ibuprofen (ADVIL,MOTRIN) 200 MG tablet Take 800 mg by mouth every 8 (eight) hours as needed.   10/21/2017 at Unknown time    Patient Stressors: Medication change or noncompliance Substance abuse  Patient Strengths: Ability for insight Capable of independent living  Treatment Modalities: Medication Management, Group therapy, Case management,  1 to 1 session with clinician, Psychoeducation, Recreational therapy.   Physician Treatment Plan for Primary Diagnosis: MDD  Medication Management: Evaluate patient's response, side effects, and tolerance of medication regimen.  Therapeutic Interventions: 1 to 1 sessions, Unit Group sessions and Medication administration.  Evaluation of Outcomes: Not Met  Physician Treatment Plan for Secondary Diagnosis: Active Problems:   MDD (major depressive disorder)  Medication Management: Evaluate patient's response, side effects, and tolerance of medication regimen.  Therapeutic Interventions: 1 to 1 sessions, Unit Group sessions and Medication  administration.  Evaluation of Outcomes: Not Met   RN Treatment Plan for Primary Diagnosis: MDD Long Term Goal(s): Knowledge of disease and therapeutic regimen to maintain health will improve  Short Term Goals: Ability to remain free from injury will improve, Ability to demonstrate self-control, Ability to disclose and discuss suicidal ideas and Ability to identify and develop effective coping behaviors will improve  Medication Management: RN will administer medications as ordered by provider, will assess and evaluate patient's response and provide education to patient for prescribed  medication. RN will report any adverse and/or side effects to prescribing provider.  Therapeutic Interventions: 1 on 1 counseling sessions, Psychoeducation, Medication administration, Evaluate responses to treatment, Monitor vital signs and CBGs as ordered, Perform/monitor CIWA, COWS, AIMS and Fall Risk screenings as ordered, Perform wound care treatments as ordered.  Evaluation of Outcomes: Not Met   LCSW Treatment Plan for Primary Diagnosis: MDD Long Term Goal(s): Safe transition to appropriate next level of care at discharge, Engage patient in therapeutic group addressing interpersonal concerns.  Short Term Goals: Engage patient in aftercare planning with referrals and resources, Facilitate patient progression through stages of change regarding substance use diagnoses and concerns and Identify triggers associated with mental health/substance abuse issues  Therapeutic Interventions: Assess for all discharge needs, 1 to 1 time with Social worker, Explore available resources and support systems, Assess for adequacy in community support network, Educate family and significant other(s) on suicide prevention, Complete Psychosocial Assessment, Interpersonal group therapy.  Evaluation of Outcomes: Not Met   Progress in Treatment: Attending groups: No. Participating in groups: No. New to unit. Continuing to assess.  Taking medication as prescribed: Yes. Toleration medication: Yes. Family/Significant other contact made: No, will contact:  pt's husband if patient consents to collateral contact.  Patient understands diagnosis: Yes. Discussing patient identified problems/goals with staff: Yes. Medical problems stabilized or resolved: Yes. Denies suicidal/homicidal ideation: Yes. Issues/concerns per patient self-inventory: No. Other: n/a   New problem(s) identified: No, Describe:  n/a  New Short Term/Long Term Goal(s): detox, medication management for mood stabilization; development of  comprehensive mental wellness/sobriety plan; elimination of SI thoughts.   Discharge Plan or Barriers: CSW assessing for appropriate referrals. No current outpatient providers noted. Joyce pamphlet and AA list provided to pt for additional community support.   Reason for Continuation of Hospitalization: Anxiety Depression Medication stabilization Suicidal ideation Withdrawal symptoms  Mania  Estimated Length of Stay: Monday, 10/27/17  Attendees: Patient: 10/24/2017 10:08 AM  Physician: Dr. Sanjuana Letters MD; Dr. Nancy Fetter MD 10/24/2017 10:08 AM  Nursing: Bertrum Sol RN 10/24/2017 10:08 AM  RN Care Manager:x 10/24/2017 10:08 AM  Social Worker: Press photographer, LCSW 10/24/2017 10:08 AM  Recreational Therapist: x 10/24/2017 10:08 AM  Other: Lindell Spar NP; Darnelle Maffucci Money NP 10/24/2017 10:08 AM  Other:  10/24/2017 10:08 AM  Other: 10/24/2017 10:08 AM    Scribe for Treatment Team: [place], LCSW 10/24/2017 10:08 AM
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
## 2017-10-24 NOTE — H&P (Signed)
Psychiatric Admission Assessment Adult  Patient Identification: Jill Peters  MRN:  [id number]  Date of Evaluation:  10/24/2017  Chief Complaint:  MDD BIPOLAR DISORDER SUBSTANCE USE DISORDER  Principal Diagnosis: Alcohol use disorder, severe, dependence ([place]), Bipolar affective disorder, manic episodes.  Diagnosis:   Patient Active Problem List   Diagnosis Date Noted  . GERD (gastroesophageal reflux disease) [K21.9] 07/17/2011    Priority: Low  . Alcohol use disorder, severe, dependence ([place]) [F10.20] 10/24/2017  . MDD (major depressive disorder) [F32.9] 10/23/2017  . Major depression [F32.9] 07/29/2013  . Opioid dependence ([place]) [F11.20] 07/24/2013  . Benzodiazepine dependence ([place]) [F13.20] 07/24/2013  . Pyelonephritis [N12] 01/20/2013  . SIRS (systemic inflammatory response syndrome) (HCC) [R65.10] 01/20/2013  . Hyponatremia [E87.1] 01/20/2013  . Lumbar back pain [M54.5] 08/12/2011  . Hypertension [I10] 07/17/2011  . Hidradenitis suppurativa [L73.2] 07/17/2011   History of Present Illness: This is an admission assessment for this 36 year old Caucasian female with hx of mental illness & alcohol use disorder. Admitted to the [facility] from the [facility] with complaints of suicidal ideations, self-mutilating behavior, hallucinations & mania. Her BAL on admission was 130 & UDS positive for THC. Chart review indicated that patient has stopped her mental health medications x 6 months while alcohol consumption increased. There is a report of mental breakdown as well.  During this assessment, Jill Peters reports, "My husband took me to the [facility] sometime last week. I have bipolar disorder & anxiety disorder. I went off of my mental health medications several months ago because one of the medicines (Lexapro) was making me sick to my stomach. I was doing okay briefly after I got off the medicines, then, the anxiety worsened in the early November, 2018. I was medicating  with alcohol which worsened since November of this year. The alcohol consumption helped initially, then, it worsened. Then, I started feeling like I was out of my mind. My husband then took me to the hospital. I was also feeling hopeless, crying all the time, felt like I was seeing & hearing things. I do believe that the AVH was due to the alcohol withdrawal. I smoke weed daily. I think coming to this place is a mistake. I do not belong here. I need to be discharged to go to substance abuse treatment on an outpatient basis. I have done that in 2015, when I was abusing & addicted to opioids. The outpatient IOP helped, it was a success for me. My mood is usually up & down. My anxiety at this point is at #10. I need to get out of this place".  Associated Signs/Symptoms:  Depression Symptoms:  depressed mood, insomnia, hopelessness, anxiety,  (Hypo) Manic Symptoms:  Impulsivity, Irritable Mood, Labiality of Mood,  Anxiety Symptoms:  Excessive Worry, Panic Symptoms,  Psychotic Symptoms:  Currently denies any hallucinations, delusions or paranoia. However, chart review indicate patient was having bizarre behavior & hallucinations upon arrival to the ED.  PTSD Symptoms: NA  Total Time spent with patient: 1 hour  Past Psychiatric History: Major depression.  Is the patient at risk to self? No.  Has the patient been a risk to self in the past 6 months? No.  Has the patient been a risk to self within the distant past? Yes.   (Hx. Self-mutilation). Is the patient a risk to others? No.  Has the patient been a risk to others in the past 6 months? No.  Has the patient been a risk to others within the  distant past? No.   Prior Inpatient Therapy: Denies. Prior Outpatient Therapy: Denies.  Alcohol Screening: 1. How often do you have a drink containing alcohol?: 4 or more times a week 2. How many drinks containing alcohol do you have on a typical day when you are drinking?: 5 or 6 3. How often do  you have six or more drinks on one occasion?: Weekly AUDIT-C Score: 9 4. How often during the last year have you found that you were not able to stop drinking once you had started?: Weekly 5. How often during the last year have you failed to do what was normally expected from you becasue of drinking?: Daily or almost daily 6. How often during the last year have you needed a first drink in the morning to get yourself going after a heavy drinking session?: Weekly 7. How often during the last year have you had a feeling of guilt of remorse after drinking?: Daily or almost daily 8. How often during the last year have you been unable to remember what happened the night before because you had been drinking?: Weekly 9. Have you or someone else been injured as a result of your drinking?: No 10. Has a relative or friend or a doctor or another health worker been concerned about your drinking or suggested you cut down?: Yes, during the last year Alcohol Use Disorder Identification Test Final Score (AUDIT): 30 Intervention/Follow-up: Alcohol Education  Substance Abuse History in the last 12 months:  Yes.    Consequences of Substance Abuse: Medical Consequences:  Liver damage, Possible death by overdose Legal Consequences:  Arrests, jail time, Loss of driving privilege. Family Consequences:  Family discord, divorce and or separation.  Previous Psychotropic Medications: Yes, Sertraline, Lexapro & Ativan.  Psychological Evaluations: No   Past Medical History:  Past Medical History:  Diagnosis Date  . Anxiety   . Bipolar 1 disorder ([place])   . Hidradenitis suppurativa   . Hypertension   . Obesity     Past Surgical History:  Procedure Laterality Date  . CHOLECYSTECTOMY    . CYSTECTOMY  2005    removed from tail bone  . WISDOM TOOTH EXTRACTION  age 72   Family History:  Family History  Problem Relation Age of Onset  . Arthritis Mother   . Leukemia Mother   . Hypertension Father   . Alcohol  abuse Father   . Bipolar disorder Father   . Arthritis Maternal Grandmother   . Hypertension Maternal Grandmother   . Alcohol abuse Cousin   . Alcohol abuse Brother        1/2 brother  . Alcohol abuse Brother        1/2 brother   Family Psychiatric  History: Bipolar disorder/Alcoholism: Father  Tobacco Screening: Have you used any form of tobacco in the last 30 days? (Cigarettes, Smokeless Tobacco, Cigars, and/or Pipes): Yes Tobacco use, Select all that apply: 5 or more cigarettes per day Are you interested in Tobacco Cessation Medications?: Yes, will notify MD for an order Counseled patient on smoking cessation including recognizing danger situations, developing coping skills and basic information about quitting provided: Refused/Declined practical counseling  Social History:  Social History   Substance and Sexual Activity  Alcohol Use Yes   Comment: every day-beer     Social History   Substance and Sexual Activity  Drug Use Yes  . Types: Hydrocodone, Marijuana   Comment: last used today    Additional Social History: Marital status: Married Number of Years  Married: 14 What types of issues is patient dealing with in the relationship?: None; Patient reports her husband is very loving and supportive.  Additional relationship information: N/A  Are you sexually active?: Yes What is your sexual orientation?: Heterosexual  Has your sexual activity been affected by drugs, alcohol, medication, or emotional stress?: No  Does patient have children?: No   Allergies:   Allergies  Allergen Reactions  . Penicillins Anaphylaxis    Has patient had a PCN reaction causing immediate rash, facial/tongue/throat swelling, SOB or lightheadedness with hypotension: Yes Has patient had a PCN reaction causing severe rash involving mucus membranes or skin necrosis: Yes Has patient had a PCN reaction that required hospitalization Yes Has patient had a PCN reaction occurring within the last 10  years: Yes If all of the above answers are "NO", then may proceed with Cephalosporin use.   Marland Kitchen Amoxicillin   . Naproxen Nausea Only   Lab Results:  Results for orders placed or performed during the hospital encounter of 10/22/17 (from the past 48 hour(s))  Urine rapid drug screen (hosp performed)     Status: Abnormal   Collection Time: 10/22/17  5:32 PM  Result Value Ref Range   Opiates NONE DETECTED NONE DETECTED   Cocaine NONE DETECTED NONE DETECTED   Benzodiazepines NONE DETECTED NONE DETECTED   Amphetamines NONE DETECTED NONE DETECTED   Tetrahydrocannabinol POSITIVE (A) NONE DETECTED   Barbiturates NONE DETECTED NONE DETECTED    Comment: (NOTE) DRUG SCREEN FOR MEDICAL PURPOSES ONLY.  IF CONFIRMATION IS NEEDED FOR ANY PURPOSE, NOTIFY LAB WITHIN 5 DAYS. LOWEST DETECTABLE LIMITS FOR URINE DRUG SCREEN Drug Class                     Cutoff (ng/mL) Amphetamine and metabolites    1000 Barbiturate and metabolites    200 Benzodiazepine                 500 Tricyclics and metabolites     300 Opiates and metabolites        300 Cocaine and metabolites        300 THC                            50   Urinalysis, Routine w reflex microscopic     Status: Abnormal   Collection Time: 10/22/17  5:32 PM  Result Value Ref Range   Color, Urine YELLOW YELLOW   APPearance HAZY (A) CLEAR   Specific Gravity, Urine 1.018 1.005 - 1.030   pH 5.0 5.0 - 8.0   Glucose, UA NEGATIVE NEGATIVE mg/dL   Hgb urine dipstick SMALL (A) NEGATIVE   Bilirubin Urine NEGATIVE NEGATIVE   Ketones, ur NEGATIVE NEGATIVE mg/dL   Protein, ur 100 (A) NEGATIVE mg/dL   Nitrite NEGATIVE NEGATIVE   Leukocytes, UA NEGATIVE NEGATIVE   RBC / HPF 0-5 0 - 5 RBC/hpf   WBC, UA 0-5 0 - 5 WBC/hpf   Bacteria, UA RARE (A) NONE SEEN   Squamous Epithelial / LPF 0-5 (A) NONE SEEN   Mucus PRESENT    Hyaline Casts, UA PRESENT    Granular Casts, UA PRESENT   POC urine preg, ED     Status: None   Collection Time: 10/22/17  6:04 PM   Result Value Ref Range   Preg Test, Ur NEGATIVE NEGATIVE    Comment:        THE SENSITIVITY OF THIS METHODOLOGY IS >  24 mIU/mL   Comprehensive metabolic panel     Status: Abnormal   Collection Time: 10/22/17  6:26 PM  Result Value Ref Range   Sodium 133 (L) 135 - 145 mmol/L   Potassium 4.2 3.5 - 5.1 mmol/L   Chloride 96 (L) 101 - 111 mmol/L   CO2 21 (L) 22 - 32 mmol/L   Glucose, Bld 91 65 - 99 mg/dL   BUN 8 6 - 20 mg/dL   Creatinine, Ser 0.57 0.44 - 1.00 mg/dL   Calcium 9.1 8.9 - 10.3 mg/dL   Total Protein 8.2 (H) 6.5 - 8.1 g/dL   Albumin 4.3 3.5 - 5.0 g/dL   AST 29 15 - 41 U/L   ALT 21 14 - 54 U/L   Alkaline Phosphatase 73 38 - 126 U/L   Total Bilirubin 0.5 0.3 - 1.2 mg/dL   GFR calc non Af Amer >60 >60 mL/min   GFR calc Af Amer >60 >60 mL/min    Comment: (NOTE) The eGFR has been calculated using the CKD EPI equation. This calculation has not been validated in all clinical situations. eGFR's persistently <60 mL/min signify possible Chronic Kidney Disease.    Anion gap 16 (H) 5 - 15  CBC with Diff     Status: Abnormal   Collection Time: 10/22/17  6:26 PM  Result Value Ref Range   WBC 5.6 4.0 - 10.5 K/uL   RBC 4.91 3.87 - 5.11 MIL/uL   Hemoglobin 14.2 12.0 - 15.0 g/dL   HCT 42.6 36.0 - 46.0 %   MCV 86.8 78.0 - 100.0 fL   MCH 28.9 26.0 - 34.0 pg   MCHC 33.3 30.0 - 36.0 g/dL   RDW 15.4 11.5 - 15.5 %   Platelets 117 (L) 150 - 400 K/uL    Comment: SPECIMEN CHECKED FOR CLOTS PLATELET COUNT CONFIRMED BY SMEAR    Neutrophils Relative % 58 %   Neutro Abs 3.2 1.7 - 7.7 K/uL   Lymphocytes Relative 35 %   Lymphs Abs 1.9 0.7 - 4.0 K/uL   Monocytes Relative 6 %   Monocytes Absolute 0.4 0.1 - 1.0 K/uL   Eosinophils Relative 1 %   Eosinophils Absolute 0.1 0.0 - 0.7 K/uL   Basophils Relative 0 %   Basophils Absolute 0.0 0.0 - 0.1 K/uL  Ethanol     Status: Abnormal   Collection Time: 10/22/17  6:32 PM  Result Value Ref Range   Alcohol, Ethyl (B) 130 (H) <10 mg/dL     Comment:        LOWEST DETECTABLE LIMIT FOR SERUM ALCOHOL IS 10 mg/dL FOR MEDICAL PURPOSES ONLY    Blood Alcohol level:  Lab Results  Component Value Date   ETH 130 (H) 49/44/9675   Metabolic Disorder Labs:  No results found for: HGBA1C, MPG No results found for: PROLACTIN No results found for: CHOL, TRIG, HDL, CHOLHDL, VLDL, LDLCALC  Current Medications: Current Facility-Administered Medications  Medication Dose Route Frequency Provider Last Rate Last Dose  . acetaminophen (TYLENOL) tablet 650 mg  650 mg Oral Q6H PRN Rankin, Shuvon B, NP      . alum & mag hydroxide-simeth (MAALOX/MYLANTA) 200-200-20 MG/5ML suspension 30 mL  30 mL Oral Q4H PRN Rankin, Shuvon B, NP      . ARIPiprazole (ABILIFY) tablet 5 mg  5 mg Oral Daily Nwoko, Agnes I, NP   5 mg at 10/24/17 1319  . chlordiazePOXIDE (LIBRIUM) capsule 25 mg  25 mg Oral Q6H PRN Rankin, Shuvon B, NP      .  chlordiazePOXIDE (LIBRIUM) capsule 25 mg  25 mg Oral QID Rankin, Shuvon B, NP   25 mg at 10/24/17 1319   Followed by  . [START ON 10/25/2017] chlordiazePOXIDE (LIBRIUM) capsule 25 mg  25 mg Oral TID Rankin, Shuvon B, NP       Followed by  . [START ON 10/26/2017] chlordiazePOXIDE (LIBRIUM) capsule 25 mg  25 mg Oral BH-qamhs Rankin, Shuvon B, NP       Followed by  . [START ON 10/27/2017] chlordiazePOXIDE (LIBRIUM) capsule 25 mg  25 mg Oral Daily Rankin, Shuvon B, NP      . hydrOXYzine (ATARAX/VISTARIL) tablet 25 mg  25 mg Oral Q6H PRN Rankin, Shuvon B, NP   25 mg at 10/23/17 2156  . Influenza vac split quadrivalent PF (FLUARIX) injection 0.5 mL  0.5 mL Intramuscular Tomorrow-1000 Nwoko, Agnes I, NP      . loperamide (IMODIUM) capsule 2-4 mg  2-4 mg Oral PRN Rankin, Shuvon B, NP      . magnesium hydroxide (MILK OF MAGNESIA) suspension 30 mL  30 mL Oral Daily PRN Rankin, Shuvon B, NP      . multivitamin with minerals tablet 1 tablet  1 tablet Oral Daily Rankin, Shuvon B, NP   1 tablet at 10/24/17 0844  . nicotine (NICODERM CQ - dosed  in mg/24 hours) patch 21 mg  21 mg Transdermal Daily Lindell Spar I, NP   21 mg at 10/24/17 0844  . ondansetron (ZOFRAN-ODT) disintegrating tablet 4 mg  4 mg Oral Q6H PRN Rankin, Shuvon B, NP   4 mg at 10/23/17 1828  . pneumococcal 23 valent vaccine (PNU-IMMUNE) injection 0.5 mL  0.5 mL Intramuscular Tomorrow-1000 Nwoko, Agnes I, NP      . sertraline (ZOLOFT) tablet 50 mg  50 mg Oral Daily Nwoko, Agnes I, NP   50 mg at 10/24/17 1318  . thiamine (VITAMIN B-1) tablet 100 mg  100 mg Oral Daily Rankin, Shuvon B, NP   100 mg at 10/24/17 0844  . traZODone (DESYREL) tablet 50 mg  50 mg Oral QHS Nwoko, Agnes I, NP       PTA Medications: Medications Prior to Admission  Medication Sig Dispense Refill Last Dose  . ibuprofen (ADVIL,MOTRIN) 200 MG tablet Take 800 mg by mouth every 8 (eight) hours as needed.   10/21/2017 at Unknown time   Musculoskeletal: Strength & Muscle Tone: within normal limits Gait & Station: normal Patient leans: N/A  Psychiatric Specialty Exam: Physical Exam  Constitutional: She appears well-developed.  HENT:  Head: Normocephalic.  Eyes: Pupils are equal, round, and reactive to light.  Neck: Normal range of motion.  Cardiovascular:  Deferred  Respiratory: Effort normal.  GI: Soft.  Genitourinary:  Genitourinary Comments: Deferred  Musculoskeletal: Normal range of motion.  Neurological: She is alert.  Skin: Skin is warm.    Review of Systems  Constitutional: Positive for malaise/fatigue.  HENT: Negative.   Eyes: Negative.   Respiratory: Negative.   Cardiovascular: Negative.   Gastrointestinal: Negative.   Genitourinary: Negative.   Musculoskeletal: Positive for myalgias.  Skin: Negative.   Neurological: Negative.   Endo/Heme/Allergies: Negative.   Psychiatric/Behavioral: Positive for depression, hallucinations and substance abuse (BAL 130, UDS positive for THC). Negative for memory loss and suicidal ideas. The patient is nervous/anxious and has insomnia.      Blood pressure (!) 129/101, pulse 97, temperature (!) 97.5 F (36.4 C), temperature source Oral, resp. rate 16, height 5' 4"  (1.626 m), weight 62.6 kg (138 lb), last menstrual  period 10/01/2017.Body mass index is 23.69 kg/m.  General Appearance: Casual and Fairly Groomed  Eye Contact:  Fair  Speech:  Clear and Coherent and Normal Rate  Volume:  Normal  Mood:  Anxious, Depressed and Irritable  Affect:  Flat  Thought Process:  Coherent and Descriptions of Associations: Intact  Orientation:  Full (Time, Place, and Person)  Thought Content:  Ruminations, currently denies any hallucinations, delusions or paranoia, chart review indicated patient was having hallucinations prior to hospitalization  Suicidal Thoughts:  Currently denies any thoughts, plans or intent. chart review indicated patient was si prior to admission.  Homicidal Thoughts:  Denies  Memory:  Immediate;   Good Recent;   Good Remote;   Good  Judgement:  Fair  Insight:  Lacking  Psychomotor Activity:  Restlessness  Concentration:  Concentration: Good and Attention Span: Good  Recall:  Good  Fund of Knowledge:  Fair  Language:  Good  Akathisia:  No  Handed:  Right  AIMS (if indicated):     Assets:  Communication Skills Desire for Improvement Social Support  ADL's:  Intact  Cognition:  WNL  Sleep:  Number of Hours: 5   Treatment Plan Summary: Daily contact with patient to assess and evaluate symptoms and progress in treatment: See MAR, Md's SRA & treatment plan.  Observation Level/Precautions:  15 minute checks  Laboratory:  Per ED, UDS (+) for THC, BAL 130  Psychotherapy: Group sessions   Medications: See MAR   Consultations: As needed   Discharge Concerns: Safety, mood stability, sobriety  Estimated LOS: 2-4 days  Other: Admit to the [facility].    Physician Treatment Plan for Primary Diagnosis: Alcohol use disorder, severe, dependence ([place])  Long Term Goal(s): Improvement in symptoms so as ready for discharge:    Short Term Goals: Ability to identify changes in lifestyle to reduce recurrence of condition will improve, Ability to disclose and discuss suicidal ideas and Ability to demonstrate self-control will improve  Physician Treatment Plan for Secondary Diagnosis: Principal Problem:   Alcohol use disorder, severe, dependence ([place]) Active Problems:   MDD (major depressive disorder)  Long Term Goal(s): Improvement in symptoms so as ready for discharge  Short Term Goals: Ability to identify changes in lifestyle to reduce recurrence of condition will improve and Ability to demonstrate self-control will improve  I certify that inpatient services furnished can reasonably be expected to improve the patient's condition.    Lindell Spar, NP, PMHNP, FNP-BC. 12/21/20183:13 PM   I have reviewed NP's Note, assessement, diagnosis and plan, and agree. I have also met with patient and completed suicide risk assessment.  Jill Peters is a 36 y/o F with history of MDD  and alcohol use disorder who was admitted from [facility] with worsening depression, self-injurious behaviors, and worsening use of alcohol.   Upon interview, pt explains, "I basically went off my meds about 6 months ago, and I did okay for a while, but then I noticed I started to slip; it was anxiety first and then depression, and then I self-medicated with alcohol, and I felt like I was losing my mind." Pt endorses AH/VH while intoxicated but she otherwise denies AH/VH. She endorses SI before coming in without plan, but she denies current SI/HI. She reports she has been sleeping poorly, and she endorses depressive symptoms of anhedonia, guilty feelings, low energy, poor concentration, poor appetite, and poor motivation. She denies symptoms of mania, OCD, and TPSD. She reports using cannabis daily as well as drinking about 12 beers  per day.  Discussed with patient about treatment options. She is ina greement to be started on previous medication  of zoloft, which she notes was helpful for her anxiety and depression in the past. Pt is also in agreement to be started on trial of abilify to supplement the zoloft, and due to concern of family history of bipolar disorder and some symptoms of disorganization and agitation upon arrival which may possibly indicate underlying bipolar disorder, though that is not a clear diagnosis from the presented history.  PLAN OF CARE:   - Admit to inpatient psychiatry unit  - MDD             - Start zoloft 49m po qDay             - Start abilify 532mpo qDay  - Alcohol use disorder with alcohol withdawal             - Stat CIWA protocol with librium  - Anxiety             - start vistaril 2581mo q6h prn anxiety  - insomnia             - start trazodone 2m95m qhs prn insomnia  -Encourage participation in groups and the therapeutic milieu -Discharge planning will be ongoing     ChriMaris Berger

## 2017-10-25 DIAGNOSIS — F3130 Bipolar disorder, current episode depressed, mild or moderate severity, unspecified: Secondary | ICD-10-CM

## 2017-10-25 DIAGNOSIS — F1210 Cannabis abuse, uncomplicated: Secondary | ICD-10-CM

## 2017-10-25 MED ORDER — TRAZODONE HCL 50 MG PO TABS
50.0000 mg | ORAL_TABLET | Freq: Every evening | ORAL | Status: DC | PRN
  Administered 2017-10-25 – 2017-10-26 (×3): 50 mg via ORAL
  Filled 2017-10-25: qty 4

## 2017-10-25 NOTE — Progress Notes (Signed)
D. Pt has been calm and cooperative -pleasant during interactions. Pt observed attending group and in milieu for much of the day. Pt currently denies SI/HI and AVH. Per pt's self inventory, pt rates her depression, hopelessness and anxiety a 3/0/2, respectively. Pt writes that her goal today is "interacting with people more, being more active in group, stay positive, be more calm so my anxiety doesn't get bad."  A. Labs and vitals monitored. Pt compliant with medications. Pt supported emotionally and encouraged to express concerns and ask questions.   R. Pt remains safe with 15 minute checks. Will continue POC.

## 2017-10-25 NOTE — BHH Group Notes (Signed)
LCSW Group Therapy Note  Date/Time:  10/25/2017   1:15-2:15pm  Type of Therapy and Topic:  Group Therapy:  Fears and Unhealthy/Healthy Coping Skills  Participation Level:  Active   Description of Group:  The focus of this group was to discuss some of the prevalent fears that patients experience, and to identify the commonalities among group members.  A fun exercise was used to initiate the discussion, followed by writing on the white board a group-generated list of unhealthy coping and healthy coping techniques to deal with each fear.    Therapeutic Goals: 1. Patient will be able to distinguish between healthy and unhealthy coping skills 2. Patient will identify and describe 3 fears they experience 3. Patient will identify one positive coping strategy for each fear they experience 4. Patient will respond empathetically to peers' statements regarding fears they experience  Summary of Patient Progress:  The patient expressed herself insightfully and articulate throughout group.  Therapeutic Modalities Cognitive Behavioral Therapy Motivational Interviewing  Ambrose MantleMareida Grossman-Orr, LCSW

## 2017-10-25 NOTE — Progress Notes (Signed)
D.  Pt pleasant on approach, denies complaints at this time other than "tired".  She reports that she did not sleep very well last night.  Pt was positive for evening AA group, observed interacting appropriately with peers on the unit.  Pt denies SI/HI/AVH at this time.  A.  Support and encouragement offered, medication given as ordered  R.  Pt remain safe on the unit, will continue to monitor.

## 2017-10-25 NOTE — Progress Notes (Signed)
[facility] Progress [facility]  10/25/2017 10:50 AM Aniceto BossJennifer L Burleigh  MRN:  [id number] Subjective:  Alert, oriented, coopoeratvie, dysphoric, feels anxiety, has been admitted for depression and using alcohol. On detox  HPI: as per notes "This is an admission assessment for this 36 year old Caucasian female with hx of mental illness & alcohol use disorder. [facility] with complaints of suicidal ideations, self-mutilating behavior, hallucinations & mania. Her BAL on admission was 130 & UDS positive for THC. Chart review indicated that patient has stopped her mental health medications x 6 months while alcohol consumption increased. There is a report of mental breakdown as well.:  On evaluation today she is calmer, not irritable. Compliant. Not psychotic Tolerating detox but remains anxious, worriful No tremors  insight remains shallow but wants to improve. No suicidal thoughts     Principal Problem: MDD (major depressive disorder) Diagnosis:   Patient Active Problem List   Diagnosis Date Noted  . Alcohol use disorder, severe, dependence (HCC) [F10.20] 10/24/2017  . MDD (major depressive disorder) [F32.9] 10/23/2017  . Major depression [F32.9] 07/29/2013  . Opioid dependence (HCC) [F11.20] 07/24/2013  . Benzodiazepine dependence (HCC) [F13.20] 07/24/2013  . Pyelonephritis [N12] 01/20/2013  . SIRS (systemic inflammatory response syndrome) (HCC) [R65.10] 01/20/2013  . Hyponatremia [E87.1] 01/20/2013  . Lumbar back pain [M54.5] 08/12/2011  . GERD (gastroesophageal reflux disease) [K21.9] 07/17/2011  . Hypertension [I10] 07/17/2011  . Hidradenitis suppurativa [L73.2] 07/17/2011   Total Time spent with patient: 30 minutes  Past Psychiatric History: Bipolar, depression  Past Medical History:  Past Medical History:  Diagnosis Date  . Anxiety   . Bipolar 1 disorder (HCC)   . Hidradenitis suppurativa   . Hypertension   . Obesity     Past Surgical History:   Procedure Laterality Date  . CHOLECYSTECTOMY    . CYSTECTOMY  2005    removed from tail bone  . WISDOM TOOTH EXTRACTION  age 36   Family History:  Family History  Problem Relation Age of Onset  . Arthritis Mother   . Leukemia Mother   . Hypertension Father   . Alcohol abuse Father   . Bipolar disorder Father   . Arthritis Maternal Grandmother   . Hypertension Maternal Grandmother   . Alcohol abuse Cousin   . Alcohol abuse Brother        1/2 brother  . Alcohol abuse Brother        1/2 brother   Family Psychiatric  History: see chart Social History:  Social History   Substance and Sexual Activity  Alcohol Use Yes   Comment: every day-beer     Social History   Substance and Sexual Activity  Drug Use Yes  . Types: Hydrocodone, Marijuana   Comment: last used today    Social History   Socioeconomic History  . Marital status: Married    Spouse name: None  . Number of children: None  . Years of education: None  . Highest education level: None  Social Needs  . Financial resource strain: None  . Food insecurity - worry: None  . Food insecurity - inability: None  . Transportation needs - medical: None  . Transportation needs - non-medical: None  Occupational History  . None  Tobacco Use  . Smoking status: Current Every Day Smoker    Packs/day: 0.50    Years: 10.00    Pack years: 5.00    Types: Cigarettes  . Smokeless tobacco: Never Used  Substance and Sexual Activity  . Alcohol use: Yes    Comment: every day-beer  . Drug use: Yes    Types: Hydrocodone, Marijuana    Comment: last used today  . Sexual activity: Yes    Partners: Male  Other Topics Concern  . None  Social History Narrative   07/23/2013 AHW [name] was born and grew up in [place]. She has 3 half-siblings by her father. She reports her childhood was good. She graduated from high school, and has done some online college studies in early childhood development. She is currently  unemployed. She has been married for 10 years. She has no children. She denies any legal difficulties. She grew up as a [employer], and reports that she is now a [employer]. Her hobbies include writing, watching movies, listening to music, shopping, and cleaning. Her social support system consists of her husband and her friends in the [address] community. 07/23/2013 AHW   Additional Social History:                         Sleep: Fair  Appetite:  Fair  Current Medications: Current Facility-Administered Medications  Medication Dose Route Frequency Provider Last Rate Last Dose  . acetaminophen (TYLENOL) tablet 650 mg  650 mg Oral Q6H PRN Rankin, Shuvon B, NP   650 mg at 10/24/17 1529  . alum & mag hydroxide-simeth (MAALOX/MYLANTA) 200-200-20 MG/5ML suspension 30 mL  30 mL Oral Q4H PRN Rankin, Shuvon B, NP      . ARIPiprazole (ABILIFY) tablet 5 mg  5 mg Oral Daily Nwoko, Agnes I, NP   5 mg at 10/25/17 0824  . chlordiazePOXIDE (LIBRIUM) capsule 25 mg  25 mg Oral Q6H PRN Rankin, Shuvon B, NP      . chlordiazePOXIDE (LIBRIUM) capsule 25 mg  25 mg Oral TID Rankin, Shuvon B, NP   25 mg at 10/25/17 0826   Followed by  . [START ON 10/26/2017] chlordiazePOXIDE (LIBRIUM) capsule 25 mg  25 mg Oral BH-qamhs Rankin, Shuvon B, NP       Followed by  . [START ON 10/27/2017] chlordiazePOXIDE (LIBRIUM) capsule 25 mg  25 mg Oral Daily Rankin, Shuvon B, NP      . hydrOXYzine (ATARAX/VISTARIL) tablet 25 mg  25 mg Oral Q6H PRN Rankin, Shuvon B, NP   25 mg at 10/24/17 2108  . Influenza vac split quadrivalent PF (FLUARIX) injection 0.5 mL  0.5 mL Intramuscular Tomorrow-1000 Nwoko, Agnes I, NP      . loperamide (IMODIUM) capsule 2-4 mg  2-4 mg Oral PRN Rankin, Shuvon B, NP      . magnesium hydroxide (MILK OF MAGNESIA) suspension 30 mL  30 mL Oral Daily PRN Rankin, Shuvon B, NP      . multivitamin with minerals tablet 1 tablet  1 tablet Oral Daily Rankin, Shuvon B, NP   1 tablet at 10/25/17 0823   . nicotine (NICODERM CQ - dosed in mg/24 hours) patch 21 mg  21 mg Transdermal Daily Armandina StammerNwoko, Agnes I, NP   21 mg at 10/25/17 0824  . ondansetron (ZOFRAN-ODT) disintegrating tablet 4 mg  4 mg Oral Q6H PRN Rankin, Shuvon B, NP   4 mg at 10/23/17 1828  . pneumococcal 23 valent vaccine (PNU-IMMUNE) injection 0.5 mL  0.5 mL Intramuscular Tomorrow-1000 Nwoko, Agnes I, NP      . sertraline (ZOLOFT) tablet 50 mg  50 mg Oral Daily Nwoko, Agnes I, NP   50 mg at  10/25/17 1610  . thiamine (VITAMIN B-1) tablet 100 mg  100 mg Oral Daily Rankin, Shuvon B, NP   100 mg at 10/25/17 0823  . traZODone (DESYREL) tablet 50 mg  50 mg Oral QHS Armandina Stammer I, NP   50 mg at 10/24/17 2108    Lab Results: No results found for this or any previous visit (from the past 48 hour(s)).  Blood Alcohol level:  Lab Results  Component Value Date   ETH 130 (H) 10/22/2017    Metabolic Disorder Labs: No results found for: HGBA1C, MPG No results found for: PROLACTIN No results found for: CHOL, TRIG, HDL, CHOLHDL, VLDL, LDLCALC  Physical Findings: AIMS: Facial and Oral Movements Muscles of Facial Expression: None, normal Lips and Perioral Area: None, normal Jaw: None, normal Tongue: None, normal,Extremity Movements Upper (arms, wrists, hands, fingers): None, normal Lower (legs, knees, ankles, toes): None, normal, Trunk Movements Neck, shoulders, hips: None, normal, Overall Severity Severity of abnormal movements (highest score from questions above): None, normal Incapacitation due to abnormal movements: None, normal Patient's awareness of abnormal movements (rate only patient's report): No Awareness, Dental Status Current problems with teeth and/or dentures?: No Does patient usually wear dentures?: No  CIWA:  CIWA-Ar Total: 7 COWS:     Musculoskeletal: Strength & Muscle Tone: within normal limits Gait & Station: normal Patient leans: no lean  Psychiatric Specialty Exam: Physical Exam  Constitutional: She appears  well-developed. No distress.    Review of Systems  Cardiovascular: Negative for chest pain.  Skin: Negative for rash.  Psychiatric/Behavioral: Positive for depression and substance abuse. The patient is nervous/anxious.     Blood pressure (!) 128/93, pulse 92, temperature 98.7 F (37.1 C), temperature source Oral, resp. rate 16, height 5\' 4"  (1.626 m), weight 62.6 kg (138 lb), last menstrual period 10/01/2017.Body mass index is 23.69 kg/m.  General Appearance: Casual  Eye Contact:  Fair  Speech:  Slow  Volume:  Decreased  Mood:  Dysphoric  Affect:  Constricted  Thought Process:  Goal Directed  Orientation:  Full (Time, Place, and Person)  Thought Content:  Rumination  Suicidal Thoughts:  No  Homicidal Thoughts:  No  Memory:  Immediate;   Fair Recent;   Fair  Judgement:  Fair  Insight:  Shallow  Psychomotor Activity:  Normal  Concentration:  Concentration: Fair and Attention Span: Fair  Recall:  [employer] of Knowledge:  Fair  Language:  Fair  Akathisia:  Negative  Handed:  Right  AIMS (if indicated):     Assets:  Desire for Improvement  ADL's:  Intact  Cognition:  WNL  Sleep:  Number of Hours: 3     Treatment Plan Summary: Daily contact with patient to assess and evaluate symptoms and progress in treatment, Medication management and Plan as follows  1. Bipolar disorder; depressed.continue abilify 2. Alcohol use disorder; continue detox with librium. Anxiety persist but tolerating. Vitals noted Mood remains dysphoric, will continue current meds.  Doing somewhat better then admission presentation. Reviewed side effects and concerns  Thresa Ross, MD 10/25/2017, 10:50 AM

## 2017-10-26 NOTE — Progress Notes (Signed)
[facility] MD Progress Note  10/26/2017 10:35 AM AMBERT VIRRUETA  MRN:  [id number] Subjective:  Alert, oriented, coopoeratvie, dysphoric, feels anxiety, has been admitted for depression and using alcohol. On detox  HPI: as per notes "This is an admission assessment for this 36 year old Caucasian female with hx of mental illness & alcohol use disorder. Admitted to the [facility] from the [facility] with complaints of suicidal ideations, self-mutilating behavior, hallucinations & mania. Her BAL on admission was 130 & UDS positive for THC. Chart review indicated that patient has stopped her mental health medications x 6 months while alcohol consumption increased. There is a report of mental breakdown as well.: On evaluation today, doing fair. Tolerating withdrawals Depression is getting better Less anxious  Insight improving    Principal Problem: MDD (major depressive disorder) Diagnosis:   Patient Active Problem List   Diagnosis Date Noted  . Alcohol use disorder, severe, dependence (HCC) [F10.20] 10/24/2017  . MDD (major depressive disorder) [F32.9] 10/23/2017  . Major depression [F32.9] 07/29/2013  . Opioid dependence (HCC) [F11.20] 07/24/2013  . Benzodiazepine dependence (HCC) [F13.20] 07/24/2013  . Pyelonephritis [N12] 01/20/2013  . SIRS (systemic inflammatory response syndrome) (HCC) [R65.10] 01/20/2013  . Hyponatremia [E87.1] 01/20/2013  . Lumbar back pain [M54.5] 08/12/2011  . GERD (gastroesophageal reflux disease) [K21.9] 07/17/2011  . Hypertension [I10] 07/17/2011  . Hidradenitis suppurativa [L73.2] 07/17/2011   Total Time spent with patient: 30 minutes  Past Psychiatric History: Bipolar, depression  Past Medical History:  Past Medical History:  Diagnosis Date  . Anxiety   . Bipolar 1 disorder (HCC)   . Hidradenitis suppurativa   . Hypertension   . Obesity     Past Surgical History:  Procedure Laterality Date  . CHOLECYSTECTOMY    . CYSTECTOMY  2005    removed from  tail bone  . WISDOM TOOTH EXTRACTION  age 3   Family History:  Family History  Problem Relation Age of Onset  . Arthritis Mother   . Leukemia Mother   . Hypertension Father   . Alcohol abuse Father   . Bipolar disorder Father   . Arthritis Maternal Grandmother   . Hypertension Maternal Grandmother   . Alcohol abuse Cousin   . Alcohol abuse Brother        1/2 brother  . Alcohol abuse Brother        1/2 brother   Family Psychiatric  History: see chart Social History:  Social History   Substance and Sexual Activity  Alcohol Use Yes   Comment: every day-beer     Social History   Substance and Sexual Activity  Drug Use Yes  . Types: Hydrocodone, Marijuana   Comment: last used today    Social History   Socioeconomic History  . Marital status: Married    Spouse name: None  . Number of children: None  . Years of education: None  . Highest education level: None  Social Needs  . Financial resource strain: None  . Food insecurity - worry: None  . Food insecurity - inability: None  . Transportation needs - medical: None  . Transportation needs - non-medical: None  Occupational History  . None  Tobacco Use  . Smoking status: Current Every Day Smoker    Packs/day: 0.50    Years: 10.00    Pack years: 5.00    Types: Cigarettes  . Smokeless tobacco: Never Used  Substance and Sexual Activity  . Alcohol use: Yes    Comment: every day-beer  .  Drug use: Yes    Types: Hydrocodone, Marijuana    Comment: last used today  . Sexual activity: Yes    Partners: Male  Other Topics Concern  . None  Social History Narrative   07/23/2013 AHW [name] was born and grew up in [place]. She has 3 half-siblings by her father. She reports her childhood was good. She graduated from high school, and has done some online college studies in early childhood development. She is currently unemployed. She has been married for 10 years. She has no children. She denies any  legal difficulties. She grew up as a [employer], and reports that she is now a [employer]. Her hobbies include writing, watching movies, listening to music, shopping, and cleaning. Her social support system consists of her husband and her friends in the [address] community. 07/23/2013 AHW   Additional Social History:                         Sleep: Fair  Appetite:  Fair  Current Medications: Current Facility-Administered Medications  Medication Dose Route Frequency Provider Last Rate Last Dose  . acetaminophen (TYLENOL) tablet 650 mg  650 mg Oral Q6H PRN Rankin, Shuvon B, NP   650 mg at 10/24/17 1529  . alum & mag hydroxide-simeth (MAALOX/MYLANTA) 200-200-20 MG/5ML suspension 30 mL  30 mL Oral Q4H PRN Rankin, Shuvon B, NP      . ARIPiprazole (ABILIFY) tablet 5 mg  5 mg Oral Daily Nwoko, Agnes I, NP   5 mg at 10/26/17 0815  . chlordiazePOXIDE (LIBRIUM) capsule 25 mg  25 mg Oral Q6H PRN Rankin, Shuvon B, NP   25 mg at 10/26/17 0812  . chlordiazePOXIDE (LIBRIUM) capsule 25 mg  25 mg Oral BH-qamhs Rankin, Shuvon B, NP   25 mg at 10/26/17 0816   Followed by  . [START ON 10/27/2017] chlordiazePOXIDE (LIBRIUM) capsule 25 mg  25 mg Oral Daily Rankin, Shuvon B, NP      . hydrOXYzine (ATARAX/VISTARIL) tablet 25 mg  25 mg Oral Q6H PRN Rankin, Shuvon B, NP   25 mg at 10/25/17 2108  . Influenza vac split quadrivalent PF (FLUARIX) injection 0.5 mL  0.5 mL Intramuscular Tomorrow-1000 Nwoko, Agnes I, NP      . loperamide (IMODIUM) capsule 2-4 mg  2-4 mg Oral PRN Rankin, Shuvon B, NP      . magnesium hydroxide (MILK OF MAGNESIA) suspension 30 mL  30 mL Oral Daily PRN Rankin, Shuvon B, NP      . multivitamin with minerals tablet 1 tablet  1 tablet Oral Daily Rankin, Shuvon B, NP   1 tablet at 10/26/17 0813  . nicotine (NICODERM CQ - dosed in mg/24 hours) patch 21 mg  21 mg Transdermal Daily Armandina StammerNwoko, Agnes I, NP   21 mg at 10/26/17 0818  . ondansetron (ZOFRAN-ODT) disintegrating tablet 4 mg   4 mg Oral Q6H PRN Rankin, Shuvon B, NP   4 mg at 10/25/17 1137  . pneumococcal 23 valent vaccine (PNU-IMMUNE) injection 0.5 mL  0.5 mL Intramuscular Tomorrow-1000 Nwoko, Agnes I, NP      . sertraline (ZOLOFT) tablet 50 mg  50 mg Oral Daily Armandina StammerNwoko, Agnes I, NP   50 mg at 10/26/17 0813  . thiamine (VITAMIN B-1) tablet 100 mg  100 mg Oral Daily Rankin, Shuvon B, NP   100 mg at 10/26/17 0812  . traZODone (DESYREL) tablet 50 mg  50 mg Oral QHS PRN,MR X 1  Jackelyn PolingBerry, Jason A, NP   50 mg at 10/25/17 2108    Lab Results: No results found for this or any previous visit (from the past 48 hour(s)).  Blood Alcohol level:  Lab Results  Component Value Date   ETH 130 (H) 10/22/2017    Metabolic Disorder Labs: No results found for: HGBA1C, MPG No results found for: PROLACTIN No results found for: CHOL, TRIG, HDL, CHOLHDL, VLDL, LDLCALC  Physical Findings: AIMS: Facial and Oral Movements Muscles of Facial Expression: None, normal Lips and Perioral Area: None, normal Jaw: None, normal Tongue: None, normal,Extremity Movements Upper (arms, wrists, hands, fingers): None, normal Lower (legs, knees, ankles, toes): None, normal, Trunk Movements Neck, shoulders, hips: None, normal, Overall Severity Severity of abnormal movements (highest score from questions above): None, normal Incapacitation due to abnormal movements: None, normal Patient's awareness of abnormal movements (rate only patient's report): No Awareness, Dental Status Current problems with teeth and/or dentures?: No Does patient usually wear dentures?: No  CIWA:  CIWA-Ar Total: 0 COWS:     Musculoskeletal: Strength & Muscle Tone: within normal limits Gait & Station: normal Patient leans: no lean  Psychiatric Specialty Exam: Physical Exam  Constitutional: She appears well-developed and well-nourished. No distress.    Review of Systems  Cardiovascular: Negative for chest pain and palpitations.  Skin: Negative for rash.   Psychiatric/Behavioral: Positive for substance abuse.    Blood pressure 105/73, pulse 85, temperature 97.6 F (36.4 C), temperature source Oral, resp. rate 18, height 5\' 4"  (1.626 m), weight 62.6 kg (138 lb), last menstrual period 10/01/2017.Body mass index is 23.69 kg/m.  General Appearance: Casual  Eye Contact:  Fair  Speech:  Slow  Volume:  Decreased  Mood:  Less dysphoric  Affect:  congruent  Thought Process:  Goal Directed  Orientation:  Full (Time, Place, and Person)  Thought Content:  Rumination  Suicidal Thoughts:  No  Homicidal Thoughts:  No  Memory:  Immediate;   Fair Recent;   Fair  Judgement:  Fair  Insight:  Shallow  Psychomotor Activity:  Normal  Concentration:  Concentration: Fair and Attention Span: Fair  Recall:  [employer]  Fund of Knowledge:  Fair  Language:  Fair  Akathisia:  Negative  Handed:  Right  AIMS (if indicated):     Assets:  Desire for Improvement  ADL's:  Intact  Cognition:  WNL  Sleep:  Number of Hours: 6.75     Treatment Plan Summary: Daily contact with patient to assess and evaluate symptoms and progress in treatment, Medication management and Plan as follows  1. Bipolar disorder; less dysphoric  Continue meds 2. Alcohol use disorder; on librium, tolerating . Will continue Less dysphoric . Wants to be discharged in 2 days or earlier  Thresa RossNadeem Aiven Kampe, MD  10/26/2017, 10:35 AM

## 2017-10-26 NOTE — [facility] Group Notes (Signed)
[facility] LCSW Group Therapy Note  Date/Time:  10/26/2017 1:30-2:30pm  Type of Therapy and Topic:  Group Therapy:  Healthy and Unhealthy Supports  Participation Level:  Active   Description of Group:  Patients in this group were introduced to the idea of adding a variety of healthy supports to address the various needs in their lives.  Because of one patient's resistance and insistence that drugs help rather than harm, the Stages of Change were introduced and ideas proposed about appropriate supports for each stage.  Therapeutic Goals:   1)  discuss importance of adding supports to stay well once out of the hospital  2)  compare healthy versus unhealthy supports and identify some examples of each  3)  Describe Stages of Change  3)  generate ideas and descriptions of healthy supports that can be added for each Stage  5)  encourage active participation in and adherence to discharge plan    Summary of Patient Progress:  The patient shared well in group and gave feedback to others.   She expressed commitment to her aftercare plan and talked about her healthy supports being significant, including her husband and her cousin who is in recovery.   Therapeutic Modalities:   Motivational Interviewing Brief Solution-Focused Therapy  Ambrose MantleMareida Grossman-Orr, LCSW

## 2017-10-26 NOTE — Progress Notes (Signed)
Nursing Progress Note: 7p-7a D: Pt currently presents with a pleasant/anxious affect and behavior. Pt states "I feel good today. I just feel like I need to get home. I would probably feel a lot better if I was at home. I miss my husband and my dog." Interacting appropriately with the milieu. Pt reports good sleep during the previous night with current medication regimen. Pt did attend wrap-up group.  A: Pt provided with medications per providers orders. Pt's labs and vitals were monitored throughout the night. Pt supported emotionally and encouraged to express concerns and questions. Pt educated on medications.  R: Pt's safety ensured with 15 minute and environmental checks. Pt currently denies SI, HI, and AVH. Pt verbally contracts to seek staff if SI,HI, or AVH occurs and to consult with staff before acting on any harmful thoughts. Will continue to monitor.

## 2017-10-26 NOTE — [facility] Suicide Risk Assessment (Signed)
[facility] INPATIENT:  Family/Significant Other Suicide Prevention Education  Suicide Prevention Education:  Education Completed; Hessie DibbleJeffery Motter (pt's husband) [phone] has been identified by the patient as the family member/significant other with whom the patient will be residing, and identified as the person(s) who will aid the patient in the event of a mental health crisis (suicidal ideations/suicide attempt).  With written consent from the patient, the family member/significant other has been provided the following suicide prevention education, prior to the and/or following the discharge of the patient.  THERE ARE NO WEAPONS IN THE HOME.  PT'S HUSBAND HAS GREAT FAMILIARITY WITH MENTAL HEALTH ISSUES AND WITH PT'S OWN PRESENTATION.  HE FEELS SHE IS DOING WELL AND WILL BE READY TO COME HOME TOMORROW.  The suicide prevention education provided includes the following:  Suicide risk factors  Suicide prevention and interventions  National Suicide Hotline telephone number  [facility] assessment telephone number  [facility] Emergency Assistance 911  [facility] and/or Residential Mobile Crisis Unit telephone number  Request made of family/significant other to:  Remove weapons (e.g., guns, rifles, knives), all items previously/currently identified as safety concern.    Remove drugs/medications (over-the-counter, prescriptions, illicit drugs), all items previously/currently identified as a safety concern.  The family member/significant other verbalizes understanding of the suicide prevention education information provided.  The family member/significant other agrees to remove the items of safety concern listed above.  Carloyn JaegerMareida J Grossman-Orr 10/26/2017, 1:16 PM

## 2017-10-26 NOTE — Progress Notes (Signed)
D. Pt has been calm and cooperative- stating, "I'm ready to go home". Pt reports that she is eager to begin attending AA- reporting that she has a supportive cousin who would like to take her to meetings. Per pt's self inventory, pt rates her depression, hopelessness and anxiety a 3/0/2, respectively. Pt denies pain and withdrawal symptoms. Pt currently denies SI/HI and A/V hallucinations. Pt was observed being actively engaged in group this am  A. Labs and vitals monitored. Pt compliant with medications. Pt supported emotionally and encouraged to express concerns and ask questions.   R. Pt remains safe with 15 minute checks. Will continue POC.

## 2017-10-26 NOTE — [facility] Group Notes (Signed)

## 2017-10-27 DIAGNOSIS — F3111 Bipolar disorder, current episode manic without psychotic features, mild: Secondary | ICD-10-CM

## 2017-10-27 MED ORDER — TRAZODONE HCL 50 MG PO TABS: 50.0000 mg | ORAL_TABLET | Freq: Every evening | ORAL | 0 refills | Status: DC | PRN

## 2017-10-27 MED ORDER — SERTRALINE HCL 50 MG PO TABS: 50.0000 mg | ORAL_TABLET | Freq: Every day | ORAL | 0 refills | Status: DC

## 2017-10-27 MED ORDER — NICOTINE 21 MG/24HR TD PT24: 21.0000 mg | MEDICATED_PATCH | Freq: Every day | TRANSDERMAL | 0 refills | Status: AC

## 2017-10-27 MED ORDER — ARIPIPRAZOLE 5 MG PO TABS: 5.0000 mg | ORAL_TABLET | Freq: Every day | ORAL | 0 refills | Status: DC

## 2017-10-27 NOTE — Discharge Summary (Signed)
Physician Discharge Summary Note  Patient:  Jill Peters is an 36 y.o., female MRN:  [id number] DOB:  01/03/1981 Patient phone:  [phone] (home)  Patient address:   [address] [place] [id number],  Total Time spent with patient: Greater than 30 minutes  Date of Admission:  10/23/2017 Date of Discharge: 10-27-17  Reason for Admission:  Alcohol use disorder, SI, self mutilating behaviors.   Principal Problem: MDD (major depressive disorder)  Discharge Diagnoses: Patient Active Problem List   Diagnosis Date Noted  . Bipolar I disorder, most recent episode depressed (HCC) [F31.30]   . Alcohol use disorder, severe, dependence (HCC) [F10.20] 10/24/2017  . MDD (major depressive disorder) [F32.9] 10/23/2017  . Major depression [F32.9] 07/29/2013  . Opioid dependence (HCC) [F11.20] 07/24/2013  . Benzodiazepine dependence (HCC) [F13.20] 07/24/2013  . Pyelonephritis [N12] 01/20/2013  . SIRS (systemic inflammatory response syndrome) (HCC) [R65.10] 01/20/2013  . Hyponatremia [E87.1] 01/20/2013  . Lumbar back pain [M54.5] 08/12/2011  . GERD (gastroesophageal reflux disease) [K21.9] 07/17/2011  . Hypertension [I10] 07/17/2011  . Hidradenitis suppurativa [L73.2] 07/17/2011   Past Psychiatric History: Alcohol use disorder, Mdd  Past Medical History:  Past Medical History:  Diagnosis Date  . Anxiety   . Bipolar 1 disorder (HCC)   . Hidradenitis suppurativa   . Hypertension   . Obesity     Past Surgical History:  Procedure Laterality Date  . CHOLECYSTECTOMY    . CYSTECTOMY  2005    removed from tail bone  . WISDOM TOOTH EXTRACTION  age 89   Family History:  Family History  Problem Relation Age of Onset  . Arthritis Mother   . Leukemia Mother   . Hypertension Father   . Alcohol abuse Father   . Bipolar disorder Father   . Arthritis Maternal Grandmother   . Hypertension Maternal Grandmother   . Alcohol abuse Cousin   . Alcohol abuse Brother        1/2 brother  .  Alcohol abuse Brother        1/2 brother   Family Psychiatric  History: See H&P Social History:  Social History   Substance and Sexual Activity  Alcohol Use Yes   Comment: every day-beer     Social History   Substance and Sexual Activity  Drug Use Yes  . Types: Hydrocodone, Marijuana   Comment: last used today    Social History   Socioeconomic History  . Marital status: Married    Spouse name: None  . Number of children: None  . Years of education: None  . Highest education level: None  Social Needs  . Financial resource strain: None  . Food insecurity - worry: None  . Food insecurity - inability: None  . Transportation needs - medical: None  . Transportation needs - non-medical: None  Occupational History  . None  Tobacco Use  . Smoking status: Current Every Day Smoker    Packs/day: 0.50    Years: 10.00    Pack years: 5.00    Types: Cigarettes  . Smokeless tobacco: Never Used  Substance and Sexual Activity  . Alcohol use: Yes    Comment: every day-beer  . Drug use: Yes    Types: Hydrocodone, Marijuana    Comment: last used today  . Sexual activity: Yes    Partners: Male  Other Topics Concern  . None  Social History Narrative   07/23/2013 [facility] [name] was born and grew [place]. She has 3 half-siblings by her  father. She reports her childhood was good. She graduated from high school, and has done some online college studies in early childhood development. She is currently unemployed. She has been married for 10 years. She has no children. She denies any legal difficulties. 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 Her hobbies include writing, watching movies, listening to music, shopping, and cleaning. Her social support system consists of her husband and her friends in the 32 step community. 07/23/2013 [facility]    Hospital Course:  This is an admission assessment for this 36 year old Caucasian female  with hx of mental illness & alcohol use disorder. Admitted to the [facility] from the [facility] with complaints of suicidal ideations, self-mutilating behavior, hallucinations & mania. Her BAL on admission was 130 & UDS positive for THC. Chart review indicated that patient has stopped her mental health medications x 6 months while alcohol consumption increased. There is a report of mental breakdown as well.  During this assessment, Jill Peters reports, "My husband took me to the [facility] sometime last week. I have bipolar disorder & anxiety disorder. I went off of my mental health medications several months ago because one of the medicines (Lexapro) was making me sick to my stomach. I was doing okay briefly after I got off the medicines, then, the anxiety worsened in the early November, 2018. I was medicating with alcohol which worsened since November of this year. The alcohol consumption helped initially, then, it worsened. Then, I started feeling like I was out of my mind. My husband then took me to the hospital. I was also feeling hopeless, crying all the time, felt like I was seeing & hearing things. I do believe that the AVH was due to the alcohol withdrawal. I smoke weed daily. I think coming to this place is a mistake. I do not belong here. I need to be discharged to go to substance abuse treatment on an outpatient basis. I have done that in 2015, when I was abusing & addicted to opioids. The outpatient IOP helped, it was a success for me. My mood is usually up & down. My anxiety at this point is at #10. I need to get out of this place".  After the above admission assessment, patients presenting symptoms were identified. Her UDS was positive for THC and his BAL was 130. Detoxification treatments administered as approproiate. She was medicated & discharged on; Abilify 5 mg po daily, Zoloft 50 mg po daily for depression, Trazodone 50 mg po daily at bedtime for insomnia and nicotine patch for  smoking cessation. She tolerated his treatment regimen without any adverse effects reported.  She was enrolled & actively  participated in the group counseling sessions. AA/NA meetings were offered & held on this unit and patient actively particpated. She was able to verbalize coping skills that should help her cope better to maintain depression/mood stability upon returning home.  During the course of her hospitalization, patients improvement was monitored by observation and his daily report of symptom reduction. Evidence was further noted by  presentation of good affect and improved mood & behavior. Upon discharge, she denied any SIHI, AVH, delusional thoughts or paranoia. she also denied any substance withdrawal symptoms. Her case was presented during treatment and the  team members all agreed that Jill Peters was both mentally & medically stable to be discharged to continue mental health care on an outpatient basis as noted below. She was provided with  all the necessary information needed to make this appointment without problems. She was provided with a  pres[facility] to be taken to her local pharmacy. [facility] with all personal belongings in no apparent distress. Transportation per her arrangement.  Physical Findings: AIMS: Facial and Oral Movements Muscles of Facial Expression: None, normal Lips and Perioral Area: None, normal Jaw: None, normal Tongue: None, normal,Extremity Movements Upper (arms, wrists, hands, fingers): None, normal Lower (legs, knees, ankles, toes): None, normal, Trunk Movements Neck, shoulders, hips: None, normal, Overall Severity Severity of abnormal movements (highest score from questions above): None, normal Incapacitation due to abnormal movements: None, normal Patient's awareness of abnormal movements (rate only patient's report): No Awareness, Dental Status Current problems with teeth and/or dentures?: No Does patient usually wear  dentures?: No  CIWA:  CIWA-Ar Total: 0 COWS:     Musculoskeletal: Strength & Muscle Tone: within normal limits Gait & Station: normal Patient leans: N/A  Psychiatric Specialty Exam: Physical Exam  Nursing note and vitals reviewed. Constitutional: She is oriented to person, place, and time. She appears well-developed.  HENT:  Head: Normocephalic.  Eyes: Pupils are equal, round, and reactive to light.  Neck: Normal range of motion.  Cardiovascular: Normal rate.  Respiratory: Effort normal.  GI: Soft.  Neurological: She is alert and oriented to person, place, and time.    Review of Systems  Psychiatric/Behavioral: Positive for substance abuse. Negative for hallucinations, memory loss and suicidal ideas. Depression: imrpoved. The patient does not have insomnia. Nervous/anxious: improved.     Blood pressure 106/71, pulse (!) 107, temperature 97.7 F (36.5 C), temperature source Oral, resp. rate 16, height 5\' 4"  (1.626 m), weight 138 lb (62.6 kg), last menstrual period 10/01/2017.Body mass index is 23.69 kg/m.  See H&P   Have you used any form of tobacco in the last 30 days? (Cigarettes, Smokeless Tobacco, Cigars, and/or Pipes): Yes  Has this patient used any form of tobacco in the last 30 days? (Cigarettes, Smokeless Tobacco, Cigars, and/or Pipes): Yes, an FDA-approved tobacco cessation medication was offered at discharge.  Blood Alcohol level:  Lab Results  Component Value Date   ETH 130 (H) 10/22/2017   Metabolic Disorder Labs:  No results found for: HGBA1C, MPG No results found for: PROLACTIN No results found for: CHOL, TRIG, HDL, CHOLHDL, VLDL, LDLCALC  See Psychiatric Specialty Exam and Suicide Risk Assessment completed by Attending Physician prior to discharge.  Discharge destination:  Home  Is patient on multiple antipsychotic therapies at discharge:  No   Has Patient had three or more failed trials of antipsychotic monotherapy by history:  No  Recommended Plan for  Multiple Antipsychotic Therapies: NA   Allergies as of 10/27/2017      Reactions   Penicillins Anaphylaxis   Has patient had a PCN reaction causing immediate rash, facial/tongue/throat swelling, SOB or lightheadedness with hypotension: Yes Has patient had a PCN reaction causing severe rash involving mucus membranes or skin necrosis: Yes Has patient had a PCN reaction that required hospitalization Yes Has patient had a PCN reaction occurring within the last 10 years: Yes If all of the above answers are "NO", then may proceed with Cephalosporin use.   Amoxicillin    Naproxen Nausea Only      Medication List    STOP taking these medications   ibuprofen 200 MG tablet Commonly known as:  ADVIL,MOTRIN     TAKE these medications     Indication  ARIPiprazole 5 MG tablet Commonly  known as:  ABILIFY Take 1 tablet (5 mg total) by mouth daily. For mood control  Indication:  Mood control   nicotine 21 mg/24hr patch Commonly known as:  NICODERM CQ - dosed in mg/24 hours Place 1 patch (21 mg total) onto the skin daily. (May buy from over the counter): For smoking cessation  Indication:  Nicotine Addiction   sertraline 50 MG tablet Commonly known as:  ZOLOFT Take 1 tablet (50 mg total) by mouth daily. For depression  Indication:  Major Depressive Disorder   traZODone 50 MG tablet Commonly known as:  DESYREL Take 1 tablet (50 mg total) by mouth at bedtime as needed and may repeat dose one time if needed for sleep.  Indication:  Trouble Sleeping      Follow-up Information    [facility] Follow up.   Specialty:  Behavioral Health Why:  appt for med mgmt/therapy needed prior to d/c-office closed.  Contact information: [address] [place] [address] [phone]         Follow-up recommendations: Activity:  As tolerated Diet: As recommended by your primary care doctor. Keep all scheduled follow-up  appointments as recommended.    Comments: Patient is instructed prior to discharge to: Take all medications as prescribed by his/her mental healthcare provider. Report any adverse effects and or reactions from the medicines to his/her outpatient provider promptly. Patient has been instructed & cautioned: To not engage in alcohol and or illegal drug use while on prescription medicines. In the event of worsening symptoms, patient is instructed to call the crisis hotline, 911 and or go to the nearest ED for appropriate evaluation and treatment of symptoms. To follow-up with his/her primary care provider for your other medical issues, concerns and or health care needs.   Signed: Denzil Magnuson, NP 10/28/2017, 11:32 AM  Patient was seen and agree to plan. Have done suicide assessment plan

## 2017-10-27 NOTE — [facility] Suicide Risk Assessment (Signed)
[facility]   Principal Problem: MDD (major depressive disorder) Discharge Diagnoses:  Patient Active Problem List   Diagnosis Date Noted  . Alcohol use disorder, severe, dependence (HCC) [F10.20] 10/24/2017  . MDD (major depressive disorder) [F32.9] 10/23/2017  . Major depression [F32.9] 07/29/2013  . Opioid dependence (HCC) [F11.20] 07/24/2013  . Benzodiazepine dependence (HCC) [F13.20] 07/24/2013  . Pyelonephritis [N12] 01/20/2013  . SIRS (systemic inflammatory response syndrome) (HCC) [R65.10] 01/20/2013  . Hyponatremia [E87.1] 01/20/2013  . Lumbar back pain [M54.5] 08/12/2011  . GERD (gastroesophageal reflux disease) [K21.9] 07/17/2011  . Hypertension [I10] 07/17/2011  . Hidradenitis suppurativa [L73.2] 07/17/2011    Total Time spent with patient: 30 minutes  Musculoskeletal: Strength & Muscle Tone: within normal limits Gait & Station: normal Patient leans: no lean  Psychiatric Specialty Exam: Review of Systems  Cardiovascular: Negative for chest pain.  Psychiatric/Behavioral: Negative for depression and suicidal ideas.    Blood pressure 106/71, pulse (!) 107, temperature 97.7 F (36.5 C), temperature source Oral, resp. rate 16, height 5\' 4"  (1.626 m), weight 62.6 kg (138 lb), last menstrual period 10/01/2017.Body mass index is 23.69 kg/m.  General Appearance: Casual  Eye Contact::  Good  Speech:  Normal Rate409  Volume:  Normal  Mood:  Euthymic  Affect:  Congruent  Thought Process:  Goal Directed  Orientation:  Full (Time, Place, and Person)  Thought Content:  Logical  Suicidal Thoughts:  No  Homicidal Thoughts:  No  Memory:  Immediate;   Fair Recent;   Fair  Judgement:  Fair  Insight:  Fair  Psychomotor Activity:  Normal  Concentration:  Fair  Recall:  FiservFair  Fund of Knowledge:Fair  Language: Fair  Akathisia:  Negative  Handed:  Right  AIMS (if indicated):     Assets:  Desire for Improvement  Sleep:  Number of Hours: 6.25   Cognition: WNL  ADL's:  Intact   Mental Status Per Nursing Assessment::   On Admission:  Suicidal ideation indicated by patient, Self-harm thoughts, Self-harm behaviors  Demographic Factors:  Caucasian and Unemployed  Loss Factors: Financial problems/change in socioeconomic status  Historical Factors: Impulsivity  Risk Reduction Factors:   Positive social support, Positive therapeutic relationship and Positive coping skills or problem solving skills  Continued Clinical Symptoms:  Alcohol/Substance Abuse/Dependencies  Cognitive Features That Contribute To Risk:  None    Suicide Risk:  Minimal: No identifiable suicidal ideation.  Patients presenting with no risk factors but with morbid ruminations; may be classified as minimal risk based on the severity of the depressive symptoms  Follow-up Information    [facility] Follow up.   Specialty:  Behavioral Health Why:  appt for med mgmt/therapy needed prior to d/c-office closed.  Contact information: [address] [place] [id number] [phone]        See discharge summary for details. No withdrawals. Stable for discharge. Depression improved  Plan Of Care/Follow-up recommendations:  Activity:  as tolerated Diet:  regular  Thresa RossNadeem Iyanah Demont, MD 10/27/2017, 10:36 AM

## 2017-10-27 NOTE — Progress Notes (Signed)
  [facility] Discharge Plan :  Will you be returning to the same living situation after discharge:  Yes,  home At discharge, do you have transportation home?: Yes,  family Do you have the ability to pay for your medications: Yes,  insurance  Release of information consent forms completed and in the chart;  Patient's signature needed at discharge.  Patient to Follow up at: Follow-up Information    [facility] Follow up.   Specialty:  Behavioral Health Why:  appt for med mgmt/therapy needed prior to d/c-office closed.  Contact information: [address] [name] [id number] [phone]          Next level of care provider has access to [name]: yes  Safety Planning and Suicide Prevention discussed: Yes,  yes  Have you used any form of tobacco in the last 30 days? (Cigarettes, Smokeless Tobacco, Cigars, and/or Pipes): Yes  Has patient been referred to the Quitline?: Patient refused referral  Patient has been referred for addiction treatment: Yes  Jill RogueRodney B Rimas Gilham, LCSW 10/27/2017, 11:21 AM

## 2017-10-27 NOTE — Progress Notes (Signed)
Recreation Therapy Notes  Date: 10/27/17 Time: 0930 Location: [address] Group Room  Group Topic: Stress Management  Goal Area(s) Addresses:  Patient will verbalize importance of using healthy stress management.  Patient will identify positive emotions associated with healthy stress management.   Intervention: Stress Management  Activity :  Meditation.  LRT introduced the stress management technique of meditation.  LRT played a meditation from the Calm app.  Patients were to listen and follow along as meditation was played.  Education:  Stress Management, Discharge Planning.   Education Outcome: Acknowledges edcuation/In group clarification offered/Needs additional education  Clinical Observations/Feedback: Pt did not attend group.   Jill Peters, LRT/CTRS     Jill RancherLindsay, Ziv Welchel A 10/27/2017 11:57 AM

## 2017-10-27 NOTE — Progress Notes (Signed)
Patient discharged to lobby. Patient was stable and appreciative at that time. All papers and prescriptions were given and valuables returned. Verbal understanding expressed. Denies SI/HI and A/VH. Patient given opportunity to express concerns and ask questions.  

## 2017-11-20 ENCOUNTER — Ambulatory Visit (HOSPITAL_COMMUNITY)
Admission: RE | Admit: 2017-11-20 | Discharge: 2017-11-20 | Disposition: A | Discharge status: Home / Self Care | Payer: 59 | Attending: Psychiatry | Admitting: Psychiatry

## 2017-11-20 ENCOUNTER — Emergency Department (HOSPITAL_COMMUNITY)
Admission: EM | Admit: 2017-11-20 | Discharge: 2017-11-20 | Discharge status: Against Medical Advice | Payer: 59 | Source: Home / Self Care

## 2017-11-20 DIAGNOSIS — Z5321 Procedure and treatment not carried out due to patient leaving prior to being seen by health care provider: Secondary | ICD-10-CM

## 2017-11-20 DIAGNOSIS — F1020 Alcohol dependence, uncomplicated: Secondary | ICD-10-CM | POA: Insufficient documentation

## 2017-11-20 NOTE — BH Assessment (Signed)
Assessment Note  Jill Peters is an 37 y.o. female. Pt denies SI/HI. Pt reports VH when she is becoming sober. Pt feels her VH is in reference to her alcohol use. Pt reports the following withdrawal symptoms: nausea, vomiting, tremors, and shaking. Pt reports previous inpatient treatment for alcohol abuse. Pt's last hospitalization was in 2017. Pt denies current outpatient treatment. Pt denies current mental health treatment.  Pt states she drinks 12-18 beers a day. Pt denies other drug use. Pt states she needs detox and inpatient treatment.   Shuvon, NP recommends D/C and recommends that the Pt go to [facility] to be seen about her withdrawal symptoms.   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 Anxiety   . Bipolar 1 disorder (HCC)   . Hidradenitis suppurativa   . Hypertension   . Obesity     Past Surgical History:  Procedure Laterality Date  . CHOLECYSTECTOMY    . CYSTECTOMY  2005    removed from tail bone  . WISDOM TOOTH EXTRACTION  age 41    Family History:  Family History  Problem Relation Age of Onset  . Arthritis Mother   . Leukemia Mother   . Hypertension Father   . Alcohol abuse Father   . Bipolar disorder Father   . Arthritis Maternal Grandmother   . Hypertension Maternal Grandmother   . Alcohol abuse Cousin   . Alcohol abuse Brother        1/2 brother  . Alcohol abuse Brother        1/2 brother    Social History:  reports that she has been smoking cigarettes.  She has a 5.00 pack-year smoking history. she has never used smokeless tobacco. She reports that she drinks alcohol. She reports that she uses drugs. Drugs: Hydrocodone and Marijuana.  Additional Social History:  Alcohol / Drug Use Pain Medications: please see mar Prescriptions: please see mar Over the Counter: please see amr History of alcohol / drug use?: Yes Longest period of sobriety (when/how long): na Negative Consequences of Use:  Financial, Legal, Personal relationships, Work / School Withdrawal Symptoms: Agitation, Delirium, Seizures, Diarrhea, Aggressive/Assaultive, DTs, Sweats, Irritability, Cramps, Nausea / Vomiting Substance #1 Name of Substance 1: alcohol 1 - Age of First Use: unknown 1 - Amount (size/oz): 12-18 beers a day 1 - Frequency: daily 1 - Duration: ongoing 1 - Last Use / Amount: 11/20/17  CIWA: CIWA-Ar BP: (!) 144/102 Pulse Rate: (!) 107 COWS:    Allergies:  Allergies  Allergen Reactions  . Penicillins Anaphylaxis    Has patient had a PCN reaction causing immediate rash, facial/tongue/throat swelling, SOB or lightheadedness with hypotension: Yes Has patient had a PCN reaction causing severe rash involving mucus membranes or skin necrosis: Yes Has patient had a PCN reaction that required hospitalization Yes Has patient had a PCN reaction occurring within the last 10 years: Yes If all of the above answers are "NO", then may proceed with Cephalosporin use.   Marland Kitchen Amoxicillin   . Naproxen Nausea Only    Home Medications:  (Not in a hospital admission)  OB/GYN Status:  No LMP recorded.  General Assessment Data Location of Assessment: [facility] Services TTS Assessment: In system Is this a Tele or Face-to-Face Assessment?: Face-to-Face Is this an Initial Assessment or a Re-assessment for this encounter?: Initial Assessment Marital status: Married [place] name: Pote Is patient pregnant?: No Pregnancy Status: No Living Arrangements: Spouse/significant other Can pt  return to current living arrangement?: Yes Admission Status: Voluntary Is patient capable of signing voluntary admission?: Yes Referral Source: Self/Family/Friend Insurance type: Hydrologist Exam [facility] Walk-in ONLY) Medical Exam completed: Yes  Crisis Care Plan Living Arrangements: Spouse/significant other Legal Guardian: Other:(self) Name of Psychiatrist: NA Name of Therapist: nA  Education Status Is  patient currently in school?: No Current Grade: MA Highest grade of school patient has completed: 12 Name of school: NA Contact person: nA  Risk to self with the past 6 months Suicidal Ideation: No Has patient been a risk to self within the past 6 months prior to admission? : No Suicidal Intent: No Has patient had any suicidal intent within the past 6 months prior to admission? : No Is patient at risk for suicide?: No Suicidal Plan?: No Has patient had any suicidal plan within the past 6 months prior to admission? : No Access to Means: No What has been your use of drugs/alcohol within the last 12 months?: NA Previous Attempts/Gestures: No How many times?: 0 Other Self Harm Risks: NA Triggers for Past Attempts: None known Intentional Self Injurious Behavior: None Family Suicide History: No Recent stressful life event(s): Other (Comment)(SA) Persecutory voices/beliefs?: No Depression: No Depression Symptoms: (pt denies) Substance abuse history and/or treatment for substance abuse?: Yes Suicide prevention information given to non-admitted patients: Not applicable  Risk to Others within the past 6 months Homicidal Ideation: No Does patient have any lifetime risk of violence toward others beyond the six months prior to admission? : No Thoughts of Harm to Others: No Current Homicidal Intent: No Current Homicidal Plan: No Access to Homicidal Means: No Identified Victim: NA History of harm to others?: No Assessment of Violence: None Noted Violent Behavior Description: NA Does patient have access to weapons?: No Criminal Charges Pending?: No Does patient have a court date: No Is patient on probation?: No  Psychosis Hallucinations: None noted Delusions: None noted  Mental Status Report Appearance/Hygiene: Unremarkable Eye Contact: Fair Motor Activity: Freedom of movement Speech: Logical/coherent Level of Consciousness: Alert Mood: Anxious Affect: Anxious Anxiety Level:  Moderate Thought Processes: Coherent, Relevant Judgement: Unimpaired Orientation: Person, Place, Time, Situation Obsessive Compulsive Thoughts/Behaviors: None  Cognitive Functioning Concentration: Normal Memory: Recent Intact, Remote Intact IQ: Average Insight: Poor Impulse Control: Poor Appetite: Fair Weight Loss: 0 Weight Gain: 0 Sleep: Decreased Total Hours of Sleep: 6 Vegetative Symptoms: None  ADLScreening [facility] Assessment Services) Patient's cognitive ability adequate to safely complete daily activities?: Yes Patient able to express need for assistance with ADLs?: Yes Independently performs ADLs?: Yes (appropriate for developmental age)  Prior Inpatient Therapy Prior Inpatient Therapy: Yes Prior Therapy Dates: 2018 Prior Therapy Facilty/Provider(s): [facility] Reason for Treatment: SA  Prior Outpatient Therapy Prior Outpatient Therapy: No Prior Therapy Dates: NA Prior Therapy Facilty/Provider(s): NA Reason for Treatment: NA Does patient have an ACCT team?: No Does patient have Intensive In-House Services?  : No Does patient have [facility] services? : No Does patient have P4CC services?: No  ADL Screening (condition at time of admission) Patient's cognitive ability adequate to safely complete daily activities?: Yes Is the patient deaf or have difficulty hearing?: No Does the patient have difficulty seeing, even when wearing glasses/contacts?: No Does the patient have difficulty concentrating, remembering, or making decisions?: No Patient able to express need for assistance with ADLs?: Yes Does the patient have difficulty dressing or bathing?: No Independently performs ADLs?: Yes (appropriate for developmental age) Does the patient have difficulty walking or climbing stairs?: No Weakness of Legs:  None Weakness of Arms/Hands: None       Abuse/Neglect Assessment (Assessment to be complete while patient is alone) Abuse/Neglect Assessment Can Be Completed: Yes Physical  Abuse: Denies Verbal Abuse: Denies Sexual Abuse: Denies Exploitation of patient/patient's resources: Denies Self-Neglect: Denies     Merchant navy officerAdvance Directives (For Healthcare) Does Patient Have a Medical Advance Directive?: No Would patient like information on creating a medical advance directive?: No - Patient declined    Additional Information 1:1 In Past 12 Months?: No CIRT Risk: No Elopement Risk: No Does patient have medical clearance?: Yes     Disposition:  Disposition Initial Assessment Completed for this Encounter: Yes Disposition of Patient: Referred to(referred to [facility] due to alcohol withdrawals)  On Site Evaluation by:   Reviewed with Physician:    Wolfgang PhoenixLevette,Custer Pimenta D 11/20/2017 2:02 PM

## 2017-11-20 NOTE — H&P (Signed)
Behavioral Health Medical Screening Exam  Jill Peters is an 37 y.o. female presented to [facility] as walk-in requesting assistance with alcohol withdrawal.  Patient reports that she has tremors, diaphoresis, N/V and is hearing noises and seeing flies flying around.  Patient denies suicidal/homicidal/self-harm ideation and paranoia.  Patient endorses auditory/visual hallucination that has occurred with the withdrawal of alcohol.    Total Time spent with patient: 30 minutes  Psychiatric Specialty Exam: Physical Exam  ROS  Blood pressure (!) 144/102, pulse (!) 107, temperature 98.7 F (37.1 C), resp. rate 16, SpO2 100 %.There is no height or weight on file to calculate BMI.  General Appearance: Casual  Eye Contact:  Good  Speech:  Clear and Coherent and Normal Rate  Volume:  Normal  Mood:  Anxious  Affect:  Congruent  Thought Process:  Coherent and Goal Directed  Orientation:  Full (Time, Place, and Person)  Thought Content:  Hallucinations: Auditory Visual  Suicidal Thoughts:  No  Homicidal Thoughts:  No  Memory:  Immediate;   Good Recent;   Good Remote;   Good  Judgement:  Fair  Insight:  Fair  Psychomotor Activity:  Tremor  Concentration: Concentration: Fair and Attention Span: Fair  Recall:  Good  Fund of Knowledge:Good  Language: Good  Akathisia:  No  Handed:  Right  AIMS (if indicated):     Assets:  Communication Skills Desire for Improvement Housing Social Support  Sleep:       Musculoskeletal: Strength & Muscle Tone: within normal limits Gait & Station: normal Patient leans: N/A  Blood pressure (!) 144/102, pulse (!) 107, temperature 98.7 F (37.1 C), resp. rate 16, SpO2 100 %.  Recommendations:  Referral to ED for medical clearance rule out DT's.  Patient psychiatrically cleared and does not need inpatient psychiatric treatment.    Based on my evaluation the patient appears to have an emergency medical condition for which I recommend the patient be  transferred to the emergency department for further evaluation.   Disposition: No evidence of imminent risk to self or others at present.   Patient does not meet criteria for psychiatric inpatient admission.  Shuvon Rankin, NP 11/20/2017, 12:36 PM

## 2017-12-02 ENCOUNTER — Ambulatory Visit (INDEPENDENT_AMBULATORY_CARE_PROVIDER_SITE_OTHER): Payer: 59 | Admitting: Psychology

## 2017-12-02 ENCOUNTER — Encounter (HOSPITAL_COMMUNITY): Payer: Self-pay | Admitting: Psychology

## 2017-12-02 DIAGNOSIS — F1020 Alcohol dependence, uncomplicated: Secondary | ICD-10-CM | POA: Diagnosis not present

## 2017-12-02 DIAGNOSIS — F3130 Bipolar disorder, current episode depressed, mild or moderate severity, unspecified: Secondary | ICD-10-CM | POA: Diagnosis not present
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
## 2017-12-04 ENCOUNTER — Other Ambulatory Visit (HOSPITAL_COMMUNITY): Payer: 59 | Attending: Psychiatry | Admitting: Psychology

## 2017-12-04 ENCOUNTER — Ambulatory Visit (HOSPITAL_COMMUNITY): Payer: Self-pay | Admitting: Psychology

## 2017-12-04 DIAGNOSIS — F419 Anxiety disorder, unspecified: Secondary | ICD-10-CM | POA: Insufficient documentation

## 2017-12-04 DIAGNOSIS — F3130 Bipolar disorder, current episode depressed, mild or moderate severity, unspecified: Secondary | ICD-10-CM

## 2017-12-04 DIAGNOSIS — F1020 Alcohol dependence, uncomplicated: Secondary | ICD-10-CM

## 2017-12-04 DIAGNOSIS — F319 Bipolar disorder, unspecified: Secondary | ICD-10-CM | POA: Insufficient documentation

## 2017-12-07 ENCOUNTER — Encounter (HOSPITAL_COMMUNITY): Payer: Self-pay | Admitting: Psychology

## 2017-12-07 NOTE — Progress Notes (Addendum)
    Daily Group Progress Note  Program: CD-IOP   12/07/2017 [name] [id number]  Diagnosis: F10.20  Alcohol Use Disorder, severe  Sobriety Date: 12/01/17  Group Time: 1-2:30pm  Participation Level: Active  Behavioral Response: Sharing  Type of Therapy: Process Group  Interventions: Supportive  Topic: Process:  Patients were active and engaged in process session.  Patients shared reflections about the previous session's discussion of boundaries.  Patients shared accomplishments and challenges with relationships and recovery.  Patients raised questions and shared experiences about sobriety, 12-Step Meetings and sponsors.  Counselors collected two UDS.  Group Time: 2:30-4pm  Participation Level: Active  Behavioral Response: Sharing  Type of Therapy: Psycho-education Group  Interventions: Water engineerocial Skills Training  Topic: Boundaries; Part 3/Graduation. Patients were active and engaged in psychoeducation session, in which counselors continued to facilitate discussion around the previous session's topic of boundaries.  Patients shared their "homework" assignment of identifying someone in their life who they struggle to set boundaries with and in what areas boundaries are rigid, porous or healthy.  The group also celebrated the graduation of a member who completed the program.  Summary: Patient was active and engaged in her first session in group.  Patient shared with the group about her previous positive experience in CD-IOP about four years ago and what has happened since then.  Patient shared that her addiction transferred to food when she and her husband abruptly moved to [place] during her first time in CD-IOP.  When patient returned to [place], she lost a lot of weight due to anxiety and then began drinking again.  Patient is currently staying with her aunt, who is supportive in patient's recovery and sobriety.  [facility], which she attributes to her Bipolar Disorder going unmanaged.  After her discharge, patient found out that her husband had been talking to other women on dating apps.  Patient responded well and was very interested in discussion around boundaries, sharing that her boundaries with her husband are rigid in some areas and very porous in others.  Patient expressed desire to work on time and emotional boundaries with her husband and talked with the group about practical ways to implement these.  UDS collected: Yes Results: positive for Ativan and Gabapentin  AA/NA attended?: yes, Tuesday  Sponsor?: No   Charmian Muffnn Andree Golphin, LCAS 12/07/2017 1:49 PM

## 2017-12-08 ENCOUNTER — Encounter (HOSPITAL_COMMUNITY): Payer: Self-pay | Admitting: Medical

## 2017-12-08 ENCOUNTER — Other Ambulatory Visit (HOSPITAL_COMMUNITY): Payer: 59

## 2017-12-08 ENCOUNTER — Other Ambulatory Visit (HOSPITAL_COMMUNITY): Payer: 59 | Attending: Psychiatry | Admitting: Medical

## 2017-12-08 VITALS — BP 126/78 | HR 98 | Ht 64.0 in | Wt 153.8 lb

## 2017-12-08 DIAGNOSIS — F1020 Alcohol dependence, uncomplicated: Secondary | ICD-10-CM | POA: Insufficient documentation

## 2017-12-08 DIAGNOSIS — F419 Anxiety disorder, unspecified: Secondary | ICD-10-CM | POA: Insufficient documentation

## 2017-12-08 DIAGNOSIS — K2960 Other gastritis without bleeding: Secondary | ICD-10-CM | POA: Insufficient documentation

## 2017-12-08 DIAGNOSIS — Z811 Family history of alcohol abuse and dependence: Secondary | ICD-10-CM | POA: Insufficient documentation

## 2017-12-08 DIAGNOSIS — F1210 Cannabis abuse, uncomplicated: Secondary | ICD-10-CM | POA: Diagnosis not present

## 2017-12-08 DIAGNOSIS — F3111 Bipolar disorder, current episode manic without psychotic features, mild: Secondary | ICD-10-CM | POA: Insufficient documentation

## 2017-12-08 DIAGNOSIS — E669 Obesity, unspecified: Secondary | ICD-10-CM | POA: Insufficient documentation

## 2017-12-08 DIAGNOSIS — F1121 Opioid dependence, in remission: Secondary | ICD-10-CM | POA: Diagnosis not present

## 2017-12-08 DIAGNOSIS — Z8659 Personal history of other mental and behavioral disorders: Secondary | ICD-10-CM | POA: Diagnosis not present

## 2017-12-08 DIAGNOSIS — F1320 Sedative, hypnotic or anxiolytic dependence, uncomplicated: Secondary | ICD-10-CM | POA: Insufficient documentation

## 2017-12-08 MED ORDER — TRAZODONE HCL 50 MG PO TABS: 50.0000 mg | ORAL_TABLET | Freq: Every evening | ORAL | 2 refills | Status: AC | PRN

## 2017-12-08 MED ORDER — OLANZAPINE 10 MG PO TABS: 10.0000 mg | ORAL_TABLET | Freq: Every day | ORAL | 2 refills | Status: AC

## 2017-12-08 MED ORDER — TOPIRAMATE 25 MG PO TABS: 25.0000 mg | ORAL_TABLET | Freq: Two times a day (BID) | ORAL | 2 refills | Status: AC

## 2017-12-08 MED ORDER — OMEPRAZOLE MAGNESIUM 20 MG PO TBEC: 20.0000 mg | DELAYED_RELEASE_TABLET | Freq: Every day | ORAL | 1 refills | Status: AC

## 2017-12-08 MED ORDER — SERTRALINE HCL 100 MG PO TABS: 100.0000 mg | ORAL_TABLET | Freq: Every day | ORAL | 2 refills | Status: AC

## 2017-12-08 NOTE — Progress Notes (Signed)
The patient is a 37 yo married, white, female seeking entry into the CD-IOP to address her addiction. She lives in [place] with her husband, but is staying with her aunt in [place] while attending the program. The patient was in this program in 2014, but was unable to finish. Her husband had determined they would move to [place] to be closer to family. When she was here four years ago, her primary drug of addiction had been opiates. Today, she reports she has been drinking daily and smoking marijuana. She reported in 2016, she underwent gall bladder surgery and since then, pain medication has made her sick. The patient's departure from [place] was very abrupt and she admitted it was difficult to find someone to manage her psych medications. Although she remained drug-free for almost three years, her Bipolar 1 disorder was largely untreated. The patient reported that while in [place], she had gained a lot of weight and weighed over 300 lbs. in the last two years, she has lost 150 lbs. she denied trying to lose the weight, but explained her anxiety has become so problematic that she got sick to her stomach after eating. The patient reported that approximately eighteen months ago she and her husband moved back to [place] and rented a home in [place]. She began drinking and smoking cannabis daily. She explained that her drinking "stops the racing thoughts'. She admitted it is difficult to sit still with her anxiety and racing thoughts and finds 'pacing' the most helpful. The patient reported she [facility] late [facility] of this past year. She returned on January 17 and was referred to this program. The patient was [place]. She is an only child. Her mother died from Leukemia when she was [age] you. Her father died when she was 37 yo. She reported that her father was a heavy drinker, but stopped drinking entirely when she was a newborn. His father was an alcoholic as were two paternal aunts. The patient has been married  for 14 years. Her husband is a recovering addict. They have a very codependent relationship and it is unclear whether her husband wants her to get well. The patient was cooperative and pleasant and agreed to return on Thursday and begin the CD-IOP.
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
## 2017-12-10 ENCOUNTER — Other Ambulatory Visit (HOSPITAL_COMMUNITY): Payer: 59 | Admitting: Psychology

## 2017-12-10 ENCOUNTER — Other Ambulatory Visit (HOSPITAL_COMMUNITY): Payer: 59

## 2017-12-10 ENCOUNTER — Encounter (HOSPITAL_COMMUNITY): Payer: Self-pay | Admitting: Psychology

## 2017-12-10 DIAGNOSIS — F3111 Bipolar disorder, current episode manic without psychotic features, mild: Secondary | ICD-10-CM

## 2017-12-10 DIAGNOSIS — F1320 Sedative, hypnotic or anxiolytic dependence, uncomplicated: Secondary | ICD-10-CM

## 2017-12-10 DIAGNOSIS — F1020 Alcohol dependence, uncomplicated: Secondary | ICD-10-CM

## 2017-12-11 ENCOUNTER — Other Ambulatory Visit (HOSPITAL_COMMUNITY): Payer: 59 | Admitting: Psychology

## 2017-12-11 ENCOUNTER — Other Ambulatory Visit (HOSPITAL_COMMUNITY): Payer: 59

## 2017-12-11 DIAGNOSIS — F1320 Sedative, hypnotic or anxiolytic dependence, uncomplicated: Secondary | ICD-10-CM

## 2017-12-11 DIAGNOSIS — F3111 Bipolar disorder, current episode manic without psychotic features, mild: Secondary | ICD-10-CM

## 2017-12-11 DIAGNOSIS — F1020 Alcohol dependence, uncomplicated: Secondary | ICD-10-CM

## 2017-12-11 DIAGNOSIS — F1210 Cannabis abuse, uncomplicated: Secondary | ICD-10-CM

## 2017-12-12 ENCOUNTER — Encounter (HOSPITAL_COMMUNITY): Payer: Self-pay | Admitting: Psychology

## 2017-12-12 NOTE — Progress Notes (Signed)
    Daily Group Progress Note  Program: CD-IOP   12/12/2017 Jill Peters [id number]  Diagnosis: F10.20 Alcohol Use Disorder  Sobriety Date: 12/01/17  Group Time: 1-2:30pm  Participation Level: Active  Behavioral Response: Sharing  Type of Therapy: Process Group  Interventions: Supportive  Topic: Process: the first half of group was spent in process. Members identified the number of 12-step or recovery-based meetings they had attended along with any speed bumps or shining moments in early recovery. The medical director met with one patient for a medication check. Two random drug tests were collected today.  Group Time: 2:30-4pm  Participation Level: Active  Behavioral Response: Sharing  Type of Therapy: Psycho-education Group  Interventions: Assertiveness Training  Topic: Psycho-Ed: Meditation/Communication; Passive, Aggressive and Assertive. The second half of group began with a short guided medication from [facility]. It identified phrases to say to oneself while breathing in and breathing out. Members identified how the breath work had felt for them. The psycho-ed began with a review of recent topics in group and the natural move into learning about Communication. A handout was provided identifying the three major styles of communication. Members took turns reading about the different styles and identifying what type of style they most often used. The discussion included a lengthy review of passive communication with members identifying how or why they had learned this style, what it got them and/or what they avoided by being passive. The discussion was lively with interesting and new disclosures from members.   Summary: The patient appeared to day in a much improved mood than when we saw her last on Monday. She reported she had gotten her prescriptions filled and had slept very well the last two nights. Her racing thoughts, 'were not nearly as bad' and she had enjoyed a  day of shopping and lunch with her aunt yesterday. This morning, the patient reported, she had gotten her first massage ever and had thoroughly enjoyed it. The patient was not manic, but she was certainly happier and smiling far more than in the last session. She stated that the meditation had been relaxing for her. In the psycho-ed, the patient quickly identified herself as a 'passive communicator'. She reported, "I have a hard time telling people what I need". She also stated, "I let people take advantage of me". She reported that it was hard to ask for what she needed. When asked what was hard about it, she explained that it seemed selfish to ask for what she wanted and maybe other peoples' needs were more important. The patient spoke up often, frequently interrupting other members and she was asked to 'listen to the feedback' give by others. The patient seems eager to talk, and not just about the topic at hand. She has not had anyone listen to her in a long time and it appears to this counselor that she is starving to talk and be heard. If this excessive talking and moving off topic continues, the patient will be pulled aside and encouraged to listen more and talk less. We will continue to follow closely in the days ahead.   UDS collected: No Results:   AA/NA attended?: YesTuesday  Sponsor?: No   Brandon Melnick, [place] 12/12/2017 11:29 AM

## 2017-12-15 ENCOUNTER — Encounter (HOSPITAL_COMMUNITY): Payer: Self-pay | Admitting: Psychology

## 2017-12-15 ENCOUNTER — Other Ambulatory Visit (HOSPITAL_COMMUNITY): Payer: 59 | Admitting: Psychology

## 2017-12-15 ENCOUNTER — Other Ambulatory Visit (HOSPITAL_COMMUNITY): Payer: 59

## 2017-12-15 DIAGNOSIS — F3111 Bipolar disorder, current episode manic without psychotic features, mild: Secondary | ICD-10-CM

## 2017-12-15 DIAGNOSIS — F1020 Alcohol dependence, uncomplicated: Secondary | ICD-10-CM
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
## 2017-12-15 NOTE — Progress Notes (Signed)
    Daily Group Progress Note  Program: CD-IOP   12/15/2017 Aniceto BossJennifer L Legacy [id number]  Diagnosis: F10.20 Alcohol Use Disorder  Sobriety Date: 12/01/17  Group Time: 1-2:30pm  Participation Level: Active  Behavioral Response: Sharing  Type of Therapy: Process Group  Interventions: Supportive  Topic: Patients were active and engaged in process session.  The group welcomed a new member.  Patients shared accomplishments and challenges with relationships and recovery.  Several patients shared ways that they practiced assertive communication since the previous group session.  Counselors collected two UDS.  Group Time: 2:30-4pm  Participation Level: Active  Behavioral Response: Sharing  Type of Therapy: Psycho-education Group  Interventions: Assertiveness Training  Topic: Patients were active and engaged in psychoeducation session, in which counselors facilitated further discussion around passive, aggressive and assertive communication.  Counselors provided example scenarios wherein patients practiced responding assertively.  Counselor also led patients in guided meditation.  Patients responded well to communication psychoeducation and cited many examples from their personal lives.  Summary: Patient was engaged and talkative in session.  Patient shared that she set boundaries with her husband the previous day.  Instead of going over to see him this weekend, they made a compromise and he will visit her at her aunt's and then go to a 12-step meeting together.  Patient said that she would not go back to their house alone with him, as she does not feel safe.  Counselor emphasized that physical and emotional safety are top priority; patient agreed.  Patient responded well to psychoeducation about communication, but continues to dominate the conversation. This counselor will speak with her about this before the next group. This patient and another talk almost 70% of the time.  She will be  asked to listen more and speak less. After all, she is not here because of what she knows, but rather what she doesn't know.   UDS collected: No Results:   AA/NA attended?: No  Sponsor?: No   Charmian Muffnn Trayce Caravello, LCAS 12/15/2017 5:28 PM

## 2017-12-16 ENCOUNTER — Telehealth (HOSPITAL_COMMUNITY): Payer: Self-pay | Admitting: Psychology

## 2017-12-17 ENCOUNTER — Encounter (HOSPITAL_COMMUNITY): Payer: Self-pay | Admitting: Psychology

## 2017-12-17 ENCOUNTER — Other Ambulatory Visit (HOSPITAL_COMMUNITY): Payer: 59

## 2017-12-17 ENCOUNTER — Other Ambulatory Visit (HOSPITAL_COMMUNITY): Payer: 59 | Admitting: Psychology

## 2017-12-17 DIAGNOSIS — F1320 Sedative, hypnotic or anxiolytic dependence, uncomplicated: Secondary | ICD-10-CM

## 2017-12-17 DIAGNOSIS — F1020 Alcohol dependence, uncomplicated: Secondary | ICD-10-CM | POA: Diagnosis not present

## 2017-12-17 DIAGNOSIS — F3111 Bipolar disorder, current episode manic without psychotic features, mild: Secondary | ICD-10-CM

## 2017-12-17 DIAGNOSIS — F1210 Cannabis abuse, uncomplicated: Secondary | ICD-10-CM

## 2017-12-17 NOTE — Progress Notes (Signed)
    Daily Group Progress Note  Program: CD-IOP   12/17/2017 Jill Peters [id number]  Diagnosis:  Alcohol use disorder, severe, dependence (HCC)  Bipolar 1 disorder, manic, mild (HCC)   Sobriety Date: 1/28   Group Time: 1-2:30  Participation Level: Active  Behavioral Response: Appropriate and Sharing  Type of Therapy: Process Group  Interventions: CBT  Topic: Patients were active and engaged in group process session. Counselor helped pts gain insight into their addictive patterns, triggers to use, and gain skills in relapse prevention. Pts were encouraged to discuss their experience in support groups and their progress towards their individualized treatment goals.      Group Time: 2:30-4  Participation Level: Active  Behavioral Response: Appropriate and Sharing  Type of Therapy: Process Group  Interventions: CBT, Psychosocial Skills: Self Compassion and Supportive  Topic: Patients were active and engaged in psychoeducation session. COunselor showed a 20min TED talk by Haze RushingKristen Neff on "The Power of Self Compassion". Pts then discussed their reactions to the video. Counselor helped pts practice being self compassionate towards their self-criticisms. Some pts struggle to avoid comparing their situation to a more negative situation. Counselor provided feedback for how to use "I statements" and "Treat yourself w/ kindness".     Summary: Jill Peters was active and engaged in session today. She was noticeably less distracted and distracting in session after counselor discussed this issue w/ her privately before group. She stated she was feeling sick over the weekend and had vomitted multiple days. Despite this, she still made it to 2 AA meetings and spent time w/ her family members who are in recovery or are very supportive of her sobriety. Pt stated she had "the best day" w/ her cousin who took her to her home group and help Jill Peters get many phone numbers of women in  recovery. Pt stated her husband is "getting more honest" and texted her a picture of his medications that he recently received to start managing his own mental health issues. Pt stated she has scheduled a dentist appointment and is nervous since she has not done this in years. Pt verbalized understanding of concept of Self Compassion and was able to articulate positive self talk to counteract her hard self criticism.   UDS collected: Yes Results: pending  AA/NA attended?: YesSaturday and Sunday  Sponsor?: No   Wes Swan, LPCA LCASA 12/17/2017 1:32 PM

## 2017-12-18 ENCOUNTER — Other Ambulatory Visit (HOSPITAL_COMMUNITY): Payer: 59

## 2017-12-18 ENCOUNTER — Encounter (HOSPITAL_COMMUNITY): Payer: Self-pay | Admitting: Psychology

## 2017-12-18 NOTE — Progress Notes (Signed)
Daily Group Progress Note  Program: CD-IOP   12/17/17 PAMI WOOL [id number]  Diagnosis: F10.20 Alcohol Use Disorder, severe  Sobriety Date: 12/01/17  Group Time: 1-2:30pm  Participation Level: Active  Behavioral Response: Appropriate  Type of Therapy: Process Group  Interventions: Supportive  Topic: Process: The first part of group was spent in process. Members shared about the things they have done to support their sobriety since we last met. They identified any 'shining moments' or 'speed bumps'. The medical director met with one group member for a medication check. Two random drug tests were collected during today's session.   Group Time: 2:30-4pm  Participation Level: Active  Behavioral Response: Appropriate  Type of Therapy: Psycho-education Group  Interventions: Medication Management  Topic: Psycho-Ed: Pharmacist and Medications. The psycho-ed included a visiting pharmacist. EP, from [facility], spoke on the different categories of medications most commonly prescribed for people in early recovery. She fielded questions about medications for cravings as well as those for anxiety, depression and other mood disorders. The session was lively with everyone very interested. Members asked good questions and shared some of their own experiences regarding side effects as well as benefits or drawbacks.   Summary: The patient reported she was feeling very anxious today. She explained she is going to the dentist to have a tooth pulled tomorrow morning and she is terrified of the dentist. She reminded the group that he had told the dentist that she is a recovering addict and doesn't want any pain medication. When asked about the trip to [facility] this weekend, the patient reported her husband has been very supportive and seemed pleased that she was going to visit with her aunt and be with the girls this coming weekend. she noted, "He seems better. He is taking his meds and not drinking".  The patient reported, "he seems to be taking responsibility" and he was doing the grocery shopping and cleaning up the house. In the psycho-ed, the patient was very enthusiastic about the benefits she has obtained through her sleep medication, Trazadone. The patient wanted to ask a question about Lithium, but the counselor pointed out she wasn't taking that med. It was clear she was asking about her husbands' medication. The counselor encouraged her to focus on her own meds and the discussion moved on. As the session ended, the patient reminded the group she might not be here tomorrow depending on how she is feeling and how much she is bleeding. She assured the counselor she would phone either way. The patient responded well to this intervention. Her codependency remains very prominent, but she is enjoying her time with her aunt and female relatives. We will continue to follow closely in the days ahead.    UDS collected: Yes Results: pending  AA/NA attended?:   Sponsor?: No   Brandon Melnick, LCAS 12/18/2017 9:12 AM

## 2017-12-22 ENCOUNTER — Other Ambulatory Visit (HOSPITAL_COMMUNITY): Payer: 59

## 2017-12-22 ENCOUNTER — Encounter (HOSPITAL_COMMUNITY): Payer: Self-pay | Admitting: Emergency Medicine

## 2017-12-22 ENCOUNTER — Telehealth (HOSPITAL_COMMUNITY): Payer: Self-pay | Admitting: Psychology

## 2017-12-22 ENCOUNTER — Emergency Department (HOSPITAL_COMMUNITY)
Admission: EM | Admit: 2017-12-22 | Discharge: 2017-12-22 | Disposition: A | Discharge status: Home / Self Care | Payer: 59 | Attending: Emergency Medicine | Admitting: Emergency Medicine

## 2017-12-22 ENCOUNTER — Other Ambulatory Visit: Payer: Self-pay

## 2017-12-22 DIAGNOSIS — R109 Unspecified abdominal pain: Secondary | ICD-10-CM

## 2017-12-22 DIAGNOSIS — R197 Diarrhea, unspecified: Secondary | ICD-10-CM | POA: Diagnosis not present

## 2017-12-22 DIAGNOSIS — N611 Abscess of the breast and nipple: Secondary | ICD-10-CM

## 2017-12-22 DIAGNOSIS — F17210 Nicotine dependence, cigarettes, uncomplicated: Secondary | ICD-10-CM | POA: Diagnosis not present

## 2017-12-22 DIAGNOSIS — Z79899 Other long term (current) drug therapy: Secondary | ICD-10-CM | POA: Insufficient documentation

## 2017-12-22 DIAGNOSIS — R1011 Right upper quadrant pain: Secondary | ICD-10-CM | POA: Diagnosis not present

## 2017-12-22 DIAGNOSIS — R1110 Vomiting, unspecified: Secondary | ICD-10-CM | POA: Diagnosis not present

## 2017-12-22 DIAGNOSIS — I10 Essential (primary) hypertension: Secondary | ICD-10-CM | POA: Diagnosis not present

## 2017-12-22 LAB — POTASSIUM: Potassium: 4.2 mmol/L (ref 3.5–5.1)

## 2017-12-22 LAB — URINALYSIS, ROUTINE W REFLEX MICROSCOPIC
Bilirubin Urine: NEGATIVE
GLUCOSE, UA: NEGATIVE mg/dL
Ketones, ur: NEGATIVE mg/dL
LEUKOCYTES UA: NEGATIVE
NITRITE: NEGATIVE
PH: 7 (ref 5.0–8.0)
Protein, ur: NEGATIVE mg/dL
SPECIFIC GRAVITY, URINE: 1.015 (ref 1.005–1.030)

## 2017-12-22 LAB — CBC WITH DIFFERENTIAL/PLATELET
BASOS PCT: 0 %
Basophils Absolute: 0 10*3/uL (ref 0.0–0.1)
Eosinophils Absolute: 0.1 10*3/uL (ref 0.0–0.7)
Eosinophils Relative: 1 %
HCT: 41.3 % (ref 36.0–46.0)
Hemoglobin: 12.8 g/dL (ref 12.0–15.0)
LYMPHS ABS: 1.3 10*3/uL (ref 0.7–4.0)
Lymphocytes Relative: 18 %
MCH: 27.5 pg (ref 26.0–34.0)
MCHC: 31 g/dL (ref 30.0–36.0)
MCV: 88.6 fL (ref 78.0–100.0)
MONO ABS: 0.4 10*3/uL (ref 0.1–1.0)
MONOS PCT: 5 %
NEUTROS ABS: 5.6 10*3/uL (ref 1.7–7.7)
Neutrophils Relative %: 76 %
Platelets: 188 10*3/uL (ref 150–400)
RBC: 4.66 MIL/uL (ref 3.87–5.11)
RDW: 14.4 % (ref 11.5–15.5)
WBC: 7.3 10*3/uL (ref 4.0–10.5)

## 2017-12-22 LAB — COMPREHENSIVE METABOLIC PANEL
ALBUMIN: 3.8 g/dL (ref 3.5–5.0)
ALT: 18 U/L (ref 14–54)
ANION GAP: 10 (ref 5–15)
AST: 38 U/L (ref 15–41)
Alkaline Phosphatase: 60 U/L (ref 38–126)
BUN: 6 mg/dL (ref 6–20)
CO2: 25 mmol/L (ref 22–32)
Calcium: 8.8 mg/dL — ABNORMAL LOW (ref 8.9–10.3)
Chloride: 103 mmol/L (ref 101–111)
Creatinine, Ser: 0.58 mg/dL (ref 0.44–1.00)
GLUCOSE: 108 mg/dL — AB (ref 65–99)
POTASSIUM: 5.6 mmol/L — AB (ref 3.5–5.1)
Sodium: 138 mmol/L (ref 135–145)
TOTAL PROTEIN: 8 g/dL (ref 6.5–8.1)
Total Bilirubin: 0.8 mg/dL (ref 0.3–1.2)

## 2017-12-22 LAB — PREGNANCY, URINE: Preg Test, Ur: NEGATIVE

## 2017-12-22 LAB — LIPASE, BLOOD: LIPASE: 61 U/L — AB (ref 11–51)

## 2017-12-22 MED ORDER — ONDANSETRON HCL 4 MG/2ML IJ SOLN
4.0000 mg | Freq: Once | INTRAMUSCULAR | Status: AC
  Administered 2017-12-22: 4 mg via INTRAVENOUS
  Filled 2017-12-22: qty 2

## 2017-12-22 MED ORDER — SODIUM CHLORIDE 0.9 % IV BOLUS (SEPSIS)
1000.0000 mL | Freq: Once | INTRAVENOUS | Status: AC
  Administered 2017-12-22: 1000 mL via INTRAVENOUS

## 2017-12-22 MED ORDER — PROMETHAZINE HCL 25 MG/ML IJ SOLN
12.5000 mg | Freq: Once | INTRAMUSCULAR | Status: AC
  Administered 2017-12-22: 12.5 mg via INTRAVENOUS
  Filled 2017-12-22: qty 1

## 2017-12-22 MED ORDER — PROMETHAZINE HCL 25 MG PO TABS: 25.0000 mg | ORAL_TABLET | Freq: Four times a day (QID) | ORAL | 0 refills | Status: AC | PRN

## 2017-12-22 NOTE — ED Provider Notes (Signed)
[facility] EMERGENCY DEPARTMENT Provider Note   CSN: [id number] Arrival date & time: 12/22/17  0753     History   Chief Complaint Chief Complaint  Patient presents with  . Abscess    HPI Jill Peters is a 37 y.o. female presenting with 2 complaints.  The first is a right breast abscess which is been present for approximately the past week.  She was seen at [facility] him yesterday for this complaint and was given IV antibiotics and placed on p.o. clindamycin which she has not started yet.  She endorses pain and redness at the site but has had no drainage from the site.  She does have a history of hidradenitis suppurativa but denies prior breast abscesses, historically her symptoms of usually been axillary.  She denies fevers or chills.  She awoke around 2 AM today with vomiting, diarrhea, waxing and waning abdominal pain which improves with emesis or stooling.  She denies hematemesis or bloody stools.  She reports a weekend trip to [place] with her aunt, who told her she was also having similar symptoms.  She denies fevers or chills she has had approximately 4 episodes of vomiting and diarrhea since her symptoms began.  She has been n.p.o this morning has had no medications prior to arrival for symptoms.  The history is provided by the patient.    Past Medical History:  Diagnosis Date  . Anxiety   . Bipolar 1 disorder (HCC)   . Bipolar I disorder, most recent episode depressed (HCC)   . Hidradenitis suppurativa   . Hypertension   . Hyponatremia 01/20/2013  . Lumbar back pain 08/12/2011  . Major depression 07/29/2013  . MDD (major depressive disorder) 10/23/2017  . Obesity   . SIRS (systemic inflammatory response syndrome) (HCC) 01/20/2013    Patient Active Problem List   Diagnosis Date Noted  . Anxiety 12/08/2017  . History of posttraumatic stress disorder (PTSD) 12/08/2017  . Reflux gastritis 12/08/2017  . Bipolar 1 disorder, manic, mild (HCC)   . Alcohol use  disorder, severe, dependence (HCC) 10/24/2017  . Opioid dependence in remission (HCC) 07/24/2013  . Benzodiazepine dependence (HCC) 07/24/2013  . GERD (gastroesophageal reflux disease) 07/17/2011    Past Surgical History:  Procedure Laterality Date  . CHOLECYSTECTOMY    . CYSTECTOMY  2005    removed from tail bone  . WISDOM TOOTH EXTRACTION  age 77    OB History    Gravida Para Term Preterm AB Living   1 0 0 0 1 0   SAB TAB Ectopic Multiple Live Births   1 0 0 0 0       Home Medications    Prior to Admission medications   Medication Sig Start Date End Date Taking? Authorizing Provider  ibuprofen (ADVIL,MOTRIN) 200 MG tablet Take 800 mg by mouth every 6 (six) hours as needed.   Yes [provider]  OLANZapine (ZYPREXA) 10 MG tablet Take 1 tablet (10 mg total) by mouth at bedtime. 12/08/17 03/08/18 Yes Court Joy, PA-C  omeprazole (PRILOSEC OTC) 20 MG tablet Take 1 tablet (20 mg total) by mouth daily. 12/08/17 12/08/18 Yes Court Joy, PA-C  sertraline (ZOLOFT) 100 MG tablet Take 1 tablet (100 mg total) by mouth daily. 12/08/17 12/08/18 Yes Court Joy, PA-C  topiramate (TOPAMAX) 25 MG tablet Take 1 tablet (25 mg total) by mouth 2 (two) times daily. For anxiety/cravings 12/08/17 03/08/18 Yes Court Joy, PA-C  traZODone (DESYREL) 50 MG tablet  Take 1 tablet (50 mg total) by mouth at bedtime and may repeat dose one time if needed. 12/08/17 01/07/18 Yes Court JoyKober, Charles E, PA-C  nicotine (NICODERM CQ - DOSED IN MG/24 HOURS) 21 mg/24hr patch Place 1 patch (21 mg total) onto the skin daily. (May buy from over the counter): For smoking cessation 10/28/17   Armandina StammerNwoko, Agnes I, NP  promethazine (PHENERGAN) 25 MG tablet Take 1 tablet (25 mg total) by mouth every 6 (six) hours as needed for nausea or vomiting. 12/22/17   Cintia Gleed, Raynelle FanningJulie, PA-C  hydrochlorothiazide 25 MG tablet Take 25 mg by mouth daily.    01/23/12  [provider]    Family History Family History  Problem Relation  Age of Onset  . Arthritis Mother   . Leukemia Mother   . Hypertension Father   . Alcohol abuse Father   . Bipolar disorder Father   . Arthritis Maternal Grandmother   . Hypertension Maternal Grandmother   . Alcohol abuse Cousin   . Alcohol abuse Brother        1/2 brother  . Alcohol abuse Brother        1/2 brother    Social History Social History   Tobacco Use  . Smoking status: Current Every Day Smoker    Packs/day: 0.50    Years: 10.00    Pack years: 5.00    Types: Cigarettes  . Smokeless tobacco: Never Used  Substance Use Topics  . Alcohol use: Yes    Comment: every day-beer  . Drug use: Yes    Types: Hydrocodone, Marijuana    Comment: last used today     Allergies   Penicillins; Amoxicillin; and Naproxen   Review of Systems Review of Systems  Constitutional: Negative for fever.  HENT: Negative for congestion and sore throat.   Eyes: Negative.   Respiratory: Negative for chest tightness and shortness of breath.   Cardiovascular: Negative for chest pain.  Gastrointestinal: Positive for abdominal pain, diarrhea, nausea and vomiting.  Genitourinary: Negative.   Musculoskeletal: Negative for arthralgias, joint swelling and neck pain.  Skin: Positive for color change. Negative for rash and wound.  Neurological: Negative for dizziness, weakness, light-headedness, numbness and headaches.  Psychiatric/Behavioral: Negative.      Physical Exam Updated Vital Signs BP 108/73 (BP Location: Right Arm)   Pulse 73   Temp 98.2 F (36.8 C) (Oral)   Resp 16   Ht 5\' 4"  (1.626 m)   Wt 70.3 kg (155 lb)   LMP 12/01/2017   SpO2 100%   BMI 26.61 kg/m   Physical Exam  Constitutional: She appears well-developed and well-nourished.  HENT:  Head: Normocephalic and atraumatic.  Eyes: Conjunctivae are normal.  Neck: Normal range of motion.  Cardiovascular: Normal rate, regular rhythm, normal heart sounds and intact distal pulses.  Pulmonary/Chest: Effort normal and  breath sounds normal. She has no wheezes.  Abdominal: Soft. Bowel sounds are normal. She exhibits no distension and no mass. There is tenderness in the right upper quadrant and epigastric area. There is no guarding.  Musculoskeletal: Normal range of motion.  Neurological: She is alert.  Skin: Skin is warm and dry.  2 cm indurated erythematous nodule right breast at the 12 to 2 o clock position.  No drainage.  No red streaking.  No fluctuance.  Psychiatric: She has a normal mood and affect.  Nursing note and vitals reviewed.    ED Treatments / Results  Labs (all labs ordered are listed, but only abnormal results  are displayed) Labs Reviewed  COMPREHENSIVE METABOLIC PANEL - Abnormal; Notable for the following components:      Result Value   Potassium 5.6 (*)    Glucose, Bld 108 (*)    Calcium 8.8 (*)    All other components within normal limits  LIPASE, BLOOD - Abnormal; Notable for the following components:   Lipase 61 (*)    All other components within normal limits  URINALYSIS, ROUTINE W REFLEX MICROSCOPIC - Abnormal; Notable for the following components:   APPearance HAZY (*)    Hgb urine dipstick SMALL (*)    Bacteria, UA RARE (*)    Squamous Epithelial / LPF 0-5 (*)    All other components within normal limits  CBC WITH DIFFERENTIAL/PLATELET  PREGNANCY, URINE  POTASSIUM    EKG  EKG Interpretation None       Radiology No results found.  Procedures Procedures (including critical care time)  Medications Ordered in ED Medications  sodium chloride 0.9 % bolus 1,000 mL (1,000 mLs Intravenous New Bag/Given 12/22/17 0859)  ondansetron (ZOFRAN) injection 4 mg (4 mg Intravenous Given 12/22/17 0858)  promethazine (PHENERGAN) injection 12.5 mg (12.5 mg Intravenous Given 12/22/17 1111)     Initial Impression / Assessment and Plan / ED Course  I have reviewed the triage vital signs and the nursing notes.  Pertinent labs & imaging results that were available during my  care of the patient were reviewed by me and considered in my medical decision making (see chart for details).     Patient has had no vomiting or diarrhea in the department, also denies abdominal pain at this time.  She does continue to have nausea despite Zofran.  Phenergan was ordered and we will try a p.o. challenge if her nausea improves.  Potassium was slightly elevated and her Cmet, this was repeated, pending result at this time.  Discussed slightly elevated lipase.  Patient endorses that she did have EtOH last night, however does not feel this is the source of her nausea and vomiting.  Denies history of pancreatitis.  After receiving Phenergan her nausea improved and she tolerated p.o. intake.  She was discharged home with Phenergan prescription.  Discussed brat diet.  Avoid alcohol.  Plan follow-up with her PCP if symptoms persist or worsen.  Also referred to general surgery for further evaluation of her breast abscess if the antibiotic she was prescribed at the outside hospital along with warm compresses do not resolve this infection.  No indication for I&D at this time.  Final Clinical Impressions(s) / ED Diagnoses   Final diagnoses:  Abdominal pain, vomiting, and diarrhea  Breast abscess    ED Discharge Orders        Ordered    promethazine (PHENERGAN) 25 MG tablet  Every 6 hours PRN     12/22/17 1204       Burgess Amor, PA-C 12/22/17 1211    Mesner, Barbara Cower, MD 12/23/17 [id number]

## 2017-12-22 NOTE — ED Triage Notes (Addendum)
Pt reports was seen last night at [facility] for right breast abscess. Pt reports was told to come back if symptoms got worse. Pt reports was sent home with abx prescription but has not filled yet. Pt reports emesis, diarrhea started approx 2 am this am. Pt reports last dose of ibuprofen for intermittent fever was 630 this am. nad noted.

## 2017-12-22 NOTE — Discharge Instructions (Signed)
Use the phenergan prescribed if needed for continued nausea or vomiting. You may benefit from the "brat" diet, bananas, rice, applesauce and toast for the next day or 2.  Make sure you are hydrating.  Avoid alcohol.
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
## 2017-12-23 ENCOUNTER — Telehealth (HOSPITAL_COMMUNITY): Payer: Self-pay | Admitting: Psychology

## 2017-12-24 ENCOUNTER — Other Ambulatory Visit (HOSPITAL_COMMUNITY): Payer: 59

## 2017-12-24 ENCOUNTER — Telehealth (HOSPITAL_COMMUNITY): Payer: Self-pay | Admitting: Psychology

## 2017-12-25 ENCOUNTER — Other Ambulatory Visit (HOSPITAL_COMMUNITY): Payer: 59

## 2017-12-26 ENCOUNTER — Observation Stay (HOSPITAL_COMMUNITY): Payer: 59 | Admitting: Anesthesiology

## 2017-12-26 ENCOUNTER — Encounter (HOSPITAL_COMMUNITY): Payer: Self-pay | Admitting: *Deleted

## 2017-12-26 ENCOUNTER — Encounter (HOSPITAL_COMMUNITY): Admission: EM | Disposition: A | Discharge status: Home / Self Care | Payer: Self-pay | Source: Home / Self Care

## 2017-12-26 ENCOUNTER — Other Ambulatory Visit: Payer: Self-pay

## 2017-12-26 ENCOUNTER — Inpatient Hospital Stay (HOSPITAL_COMMUNITY)
Admission: EM | Admit: 2017-12-26 | Discharge: 2017-12-28 | DRG: 601 | Disposition: A | Discharge status: Home / Self Care | Payer: 59 | Attending: General Surgery | Admitting: General Surgery

## 2017-12-26 DIAGNOSIS — Z9049 Acquired absence of other specified parts of digestive tract: Secondary | ICD-10-CM | POA: Diagnosis not present

## 2017-12-26 DIAGNOSIS — Z886 Allergy status to analgesic agent status: Secondary | ICD-10-CM | POA: Diagnosis not present

## 2017-12-26 DIAGNOSIS — N611 Abscess of the breast and nipple: Principal | ICD-10-CM | POA: Diagnosis present

## 2017-12-26 DIAGNOSIS — Z880 Allergy status to penicillin: Secondary | ICD-10-CM | POA: Diagnosis not present

## 2017-12-26 DIAGNOSIS — F4310 Post-traumatic stress disorder, unspecified: Secondary | ICD-10-CM | POA: Diagnosis present

## 2017-12-26 DIAGNOSIS — Z79899 Other long term (current) drug therapy: Secondary | ICD-10-CM

## 2017-12-26 DIAGNOSIS — F17210 Nicotine dependence, cigarettes, uncomplicated: Secondary | ICD-10-CM | POA: Diagnosis present

## 2017-12-26 DIAGNOSIS — Z560 Unemployment, unspecified: Secondary | ICD-10-CM

## 2017-12-26 DIAGNOSIS — F319 Bipolar disorder, unspecified: Secondary | ICD-10-CM | POA: Diagnosis present

## 2017-12-26 DIAGNOSIS — Z8249 Family history of ischemic heart disease and other diseases of the circulatory system: Secondary | ICD-10-CM | POA: Diagnosis not present

## 2017-12-26 DIAGNOSIS — F1290 Cannabis use, unspecified, uncomplicated: Secondary | ICD-10-CM | POA: Diagnosis present

## 2017-12-26 DIAGNOSIS — Z881 Allergy status to other antibiotic agents status: Secondary | ICD-10-CM

## 2017-12-26 DIAGNOSIS — K219 Gastro-esophageal reflux disease without esophagitis: Secondary | ICD-10-CM | POA: Diagnosis present

## 2017-12-26 DIAGNOSIS — F1190 Opioid use, unspecified, uncomplicated: Secondary | ICD-10-CM | POA: Diagnosis present

## 2017-12-26 DIAGNOSIS — Z818 Family history of other mental and behavioral disorders: Secondary | ICD-10-CM | POA: Diagnosis not present

## 2017-12-26 DIAGNOSIS — I10 Essential (primary) hypertension: Secondary | ICD-10-CM | POA: Diagnosis present

## 2017-12-26 HISTORY — PX: IRRIGATION AND DEBRIDEMENT ABSCESS: SHX5252

## 2017-12-26 LAB — CBC WITH DIFFERENTIAL/PLATELET
BASOS PCT: 0 %
Basophils Absolute: 0 10*3/uL (ref 0.0–0.1)
Eosinophils Absolute: 0.1 10*3/uL (ref 0.0–0.7)
Eosinophils Relative: 1 %
HEMATOCRIT: 40.9 % (ref 36.0–46.0)
Hemoglobin: 13.5 g/dL (ref 12.0–15.0)
LYMPHS PCT: 24 %
Lymphs Abs: 2.8 10*3/uL (ref 0.7–4.0)
MCH: 28.8 pg (ref 26.0–34.0)
MCHC: 33 g/dL (ref 30.0–36.0)
MCV: 87.4 fL (ref 78.0–100.0)
MONOS PCT: 4 %
Monocytes Absolute: 0.4 10*3/uL (ref 0.1–1.0)
NEUTROS ABS: 8 10*3/uL — AB (ref 1.7–7.7)
Neutrophils Relative %: 71 %
Platelets: 226 10*3/uL (ref 150–400)
RBC: 4.68 MIL/uL (ref 3.87–5.11)
RDW: 14.8 % (ref 11.5–15.5)
WBC: 11.3 10*3/uL — ABNORMAL HIGH (ref 4.0–10.5)

## 2017-12-26 LAB — URINALYSIS, ROUTINE W REFLEX MICROSCOPIC
BILIRUBIN URINE: NEGATIVE
GLUCOSE, UA: NEGATIVE mg/dL
Hgb urine dipstick: NEGATIVE
KETONES UR: NEGATIVE mg/dL
Leukocytes, UA: NEGATIVE
Nitrite: NEGATIVE
PH: 7 (ref 5.0–8.0)
Protein, ur: NEGATIVE mg/dL
Specific Gravity, Urine: 1.013 (ref 1.005–1.030)

## 2017-12-26 LAB — COMPREHENSIVE METABOLIC PANEL
ALBUMIN: 3.8 g/dL (ref 3.5–5.0)
ALK PHOS: 68 U/L (ref 38–126)
ALT: 13 U/L — ABNORMAL LOW (ref 14–54)
ANION GAP: 13 (ref 5–15)
AST: 19 U/L (ref 15–41)
BUN: 8 mg/dL (ref 6–20)
CALCIUM: 9 mg/dL (ref 8.9–10.3)
CO2: 21 mmol/L — AB (ref 22–32)
Chloride: 106 mmol/L (ref 101–111)
Creatinine, Ser: 0.75 mg/dL (ref 0.44–1.00)
GFR calc Af Amer: >60 mL/min (ref >60)
GFR calc non Af Amer: >60 mL/min (ref >60)
GLUCOSE: 101 mg/dL — AB (ref 65–99)
Potassium: 3.5 mmol/L (ref 3.5–5.1)
SODIUM: 140 mmol/L (ref 135–145)
Total Bilirubin: 0.4 mg/dL (ref 0.3–1.2)
Total Protein: 7.9 g/dL (ref 6.5–8.1)

## 2017-12-26 LAB — I-STAT CG4 LACTIC ACID, ED
Lactic Acid, Venous: 1.31 mmol/L (ref 0.5–1.9)
Lactic Acid, Venous: 1.06 mmol/L (ref 0.5–1.9)

## 2017-12-26 LAB — I-STAT BETA HCG BLOOD, ED (MC, WL, AP ONLY): I-stat hCG, quantitative: <5 m[IU]/mL (ref <5)

## 2017-12-26 SURGERY — IRRIGATION AND DEBRIDEMENT ABSCESS
Anesthesia: General | Site: Breast | Laterality: Right

## 2017-12-26 MED ORDER — HYDRALAZINE HCL 20 MG/ML IJ SOLN: 10.0000 mg | INTRAMUSCULAR | Status: DC | PRN

## 2017-12-26 MED ORDER — SERTRALINE HCL 100 MG PO TABS: 100.0000 mg | ORAL_TABLET | Freq: Every day | ORAL | Status: DC

## 2017-12-26 MED ORDER — MORPHINE SULFATE (PF) 4 MG/ML IV SOLN
4.0000 mg | Freq: Once | INTRAVENOUS | Status: AC
  Administered 2017-12-26: 4 mg via INTRAVENOUS
  Filled 2017-12-26: qty 1

## 2017-12-26 MED ORDER — PROPOFOL 10 MG/ML IV BOLUS
INTRAVENOUS | Status: AC
  Filled 2017-12-26: qty 20

## 2017-12-26 MED ORDER — ONDANSETRON HCL 4 MG/2ML IJ SOLN
INTRAMUSCULAR | Status: DC | PRN
  Administered 2017-12-26: 4 mg via INTRAVENOUS

## 2017-12-26 MED ORDER — OXYCODONE HCL 5 MG PO TABS
ORAL_TABLET | ORAL | Status: AC
  Filled 2017-12-26: qty 1

## 2017-12-26 MED ORDER — SODIUM CHLORIDE 0.9 % IV BOLUS (SEPSIS)
1000.0000 mL | Freq: Once | INTRAVENOUS | Status: AC
  Administered 2017-12-26: 1000 mL via INTRAVENOUS

## 2017-12-26 MED ORDER — ACETAMINOPHEN 650 MG RE SUPP: 650.0000 mg | Freq: Four times a day (QID) | RECTAL | Status: DC | PRN

## 2017-12-26 MED ORDER — LACTATED RINGERS IV SOLN
INTRAVENOUS | Status: DC
  Administered 2017-12-26: 18:00:00 via INTRAVENOUS

## 2017-12-26 MED ORDER — MIDAZOLAM HCL 2 MG/2ML IJ SOLN
INTRAMUSCULAR | Status: AC
  Filled 2017-12-26: qty 2

## 2017-12-26 MED ORDER — MORPHINE SULFATE (PF) 4 MG/ML IV SOLN: 1.0000 mg | INTRAVENOUS | Status: DC | PRN

## 2017-12-26 MED ORDER — CLINDAMYCIN PHOSPHATE 900 MG/50ML IV SOLN
900.0000 mg | Freq: Once | INTRAVENOUS | Status: AC
  Administered 2017-12-26: 900 mg via INTRAVENOUS
  Filled 2017-12-26: qty 50

## 2017-12-26 MED ORDER — PANTOPRAZOLE SODIUM 40 MG IV SOLR: 40.0000 mg | Freq: Every day | INTRAVENOUS | Status: DC

## 2017-12-26 MED ORDER — DIPHENHYDRAMINE HCL 25 MG PO CAPS: 25.0000 mg | ORAL_CAPSULE | Freq: Four times a day (QID) | ORAL | Status: DC | PRN

## 2017-12-26 MED ORDER — FENTANYL CITRATE (PF) 250 MCG/5ML IJ SOLN
INTRAMUSCULAR | Status: DC | PRN
  Administered 2017-12-26: 100 ug via INTRAVENOUS

## 2017-12-26 MED ORDER — SODIUM CHLORIDE 0.45 % IV SOLN: INTRAVENOUS | Status: DC

## 2017-12-26 MED ORDER — OXYCODONE HCL 5 MG/5ML PO SOLN: 5.0000 mg | Freq: Once | ORAL | Status: AC | PRN

## 2017-12-26 MED ORDER — DEXAMETHASONE SODIUM PHOSPHATE 10 MG/ML IJ SOLN
INTRAMUSCULAR | Status: DC | PRN
  Administered 2017-12-26: 10 mg via INTRAVENOUS

## 2017-12-26 MED ORDER — VANCOMYCIN HCL IN DEXTROSE 1-5 GM/200ML-% IV SOLN
1000.0000 mg | Freq: Two times a day (BID) | INTRAVENOUS | Status: DC
Start: 2017-12-27 — End: 2017-12-28
  Administered 2017-12-27 – 2017-12-28 (×3): 1000 mg via INTRAVENOUS
  Filled 2017-12-26 (×5): qty 200

## 2017-12-26 MED ORDER — EPHEDRINE SULFATE 50 MG/ML IJ SOLN
INTRAMUSCULAR | Status: DC | PRN
  Administered 2017-12-26: 10 mg via INTRAVENOUS

## 2017-12-26 MED ORDER — ONDANSETRON 4 MG PO TBDP: 4.0000 mg | ORAL_TABLET | Freq: Four times a day (QID) | ORAL | Status: DC | PRN

## 2017-12-26 MED ORDER — ONDANSETRON HCL 4 MG/2ML IJ SOLN: 4.0000 mg | Freq: Four times a day (QID) | INTRAMUSCULAR | Status: DC | PRN

## 2017-12-26 MED ORDER — VANCOMYCIN HCL 10 G IV SOLR
1500.0000 mg | Freq: Once | INTRAVENOUS | Status: AC
  Administered 2017-12-26: 1500 mg via INTRAVENOUS
  Filled 2017-12-26: qty 1500

## 2017-12-26 MED ORDER — MIDAZOLAM HCL 2 MG/2ML IJ SOLN
INTRAMUSCULAR | Status: DC | PRN
  Administered 2017-12-26: 2 mg via INTRAVENOUS

## 2017-12-26 MED ORDER — CLINDAMYCIN HCL 150 MG PO CAPS
150.0000 mg | ORAL_CAPSULE | Freq: Three times a day (TID) | ORAL | Status: DC
  Administered 2017-12-26 – 2017-12-28 (×5): 150 mg via ORAL
  Filled 2017-12-26 (×6): qty 1

## 2017-12-26 MED ORDER — ACETAMINOPHEN 325 MG PO TABS: 650.0000 mg | ORAL_TABLET | Freq: Four times a day (QID) | ORAL | Status: DC | PRN

## 2017-12-26 MED ORDER — 0.9 % SODIUM CHLORIDE (POUR BTL) OPTIME
TOPICAL | Status: DC | PRN
  Administered 2017-12-26: 1000 mL

## 2017-12-26 MED ORDER — OXYCODONE HCL 5 MG PO TABS
5.0000 mg | ORAL_TABLET | Freq: Once | ORAL | Status: AC | PRN
  Administered 2017-12-26: 5 mg via ORAL

## 2017-12-26 MED ORDER — OXYCODONE-ACETAMINOPHEN 5-325 MG PO TABS
1.0000 | ORAL_TABLET | ORAL | Status: DC | PRN
  Administered 2017-12-26: 1 via ORAL
  Administered 2017-12-27: 2 via ORAL
  Administered 2017-12-27 (×2): 1 via ORAL
  Administered 2017-12-27 – 2017-12-28 (×3): 2 via ORAL
  Filled 2017-12-26 (×2): qty 1
  Filled 2017-12-26 (×3): qty 2
  Filled 2017-12-26: qty 1
  Filled 2017-12-26: qty 2

## 2017-12-26 MED ORDER — FENTANYL CITRATE (PF) 250 MCG/5ML IJ SOLN
INTRAMUSCULAR | Status: AC
  Filled 2017-12-26: qty 5

## 2017-12-26 MED ORDER — LIDOCAINE 2% (20 MG/ML) 5 ML SYRINGE
INTRAMUSCULAR | Status: DC | PRN
  Administered 2017-12-26: 60 mg via INTRAVENOUS

## 2017-12-26 MED ORDER — ENSURE ENLIVE PO LIQD
237.0000 mL | Freq: Two times a day (BID) | ORAL | Status: DC
  Administered 2017-12-27: 237 mL via ORAL

## 2017-12-26 MED ORDER — PROPOFOL 10 MG/ML IV BOLUS
INTRAVENOUS | Status: DC | PRN
  Administered 2017-12-26: 200 mg via INTRAVENOUS

## 2017-12-26 MED ORDER — OXYCODONE HCL 5 MG PO TABS: 5.0000 mg | ORAL_TABLET | ORAL | Status: DC | PRN

## 2017-12-26 MED ORDER — TRAZODONE HCL 50 MG PO TABS: 50.0000 mg | ORAL_TABLET | Freq: Every evening | ORAL | Status: DC | PRN

## 2017-12-26 MED ORDER — DIPHENHYDRAMINE HCL 50 MG/ML IJ SOLN: 25.0000 mg | Freq: Four times a day (QID) | INTRAMUSCULAR | Status: DC | PRN

## 2017-12-26 MED ORDER — FENTANYL CITRATE (PF) 100 MCG/2ML IJ SOLN
INTRAMUSCULAR | Status: AC
  Filled 2017-12-26: qty 2

## 2017-12-26 MED ORDER — HYDROMORPHONE HCL 1 MG/ML IJ SOLN
1.0000 mg | INTRAMUSCULAR | Status: DC | PRN
  Administered 2017-12-27 (×2): 1 mg via INTRAVENOUS
  Filled 2017-12-26 (×2): qty 1

## 2017-12-26 MED ORDER — ONDANSETRON HCL 4 MG/2ML IJ SOLN
4.0000 mg | Freq: Once | INTRAMUSCULAR | Status: AC
  Administered 2017-12-26: 4 mg via INTRAVENOUS
  Filled 2017-12-26: qty 2

## 2017-12-26 MED ORDER — FENTANYL CITRATE (PF) 100 MCG/2ML IJ SOLN
25.0000 ug | INTRAMUSCULAR | Status: DC | PRN
  Administered 2017-12-26 (×2): 50 ug via INTRAVENOUS

## 2017-12-26 MED ORDER — SIMETHICONE 80 MG PO CHEW
40.0000 mg | CHEWABLE_TABLET | Freq: Four times a day (QID) | ORAL | Status: DC | PRN
  Filled 2017-12-26: qty 1

## 2017-12-26 SURGICAL SUPPLY — 31 items
BINDER BREAST LRG (GAUZE/BANDAGES/DRESSINGS) ×2 IMPLANT
BNDG GAUZE ELAST 4 BULKY (GAUZE/BANDAGES/DRESSINGS) IMPLANT
CANISTER SUCT 3000ML PPV (MISCELLANEOUS) ×3 IMPLANT
COVER SURGICAL LIGHT HANDLE (MISCELLANEOUS) ×3 IMPLANT
DRAPE LAPAROSCOPIC ABDOMINAL (DRAPES) IMPLANT
DRAPE LAPAROTOMY 100X72 PEDS (DRAPES) IMPLANT
DRAPE UTILITY XL STRL (DRAPES) ×12 IMPLANT
DRSG PAD ABDOMINAL 8X10 ST (GAUZE/BANDAGES/DRESSINGS) ×2 IMPLANT
ELECT CAUTERY BLADE 6.4 (BLADE) ×3 IMPLANT
ELECT REM PT RETURN 9FT ADLT (ELECTROSURGICAL) ×3
ELECTRODE REM PT RTRN 9FT ADLT (ELECTROSURGICAL) ×1 IMPLANT
GAUZE PACKING IODOFORM 1/4X15 (GAUZE/BANDAGES/DRESSINGS) ×2 IMPLANT
GAUZE SPONGE 4X4 12PLY STRL (GAUZE/BANDAGES/DRESSINGS) ×2 IMPLANT
GLOVE BIO SURGEON STRL SZ7.5 (GLOVE) ×3 IMPLANT
GLOVE BIOGEL PI IND STRL 8 (GLOVE) ×1 IMPLANT
GLOVE BIOGEL PI INDICATOR 8 (GLOVE) ×2
GOWN STRL REUS W/ TWL LRG LVL3 (GOWN DISPOSABLE) ×2 IMPLANT
GOWN STRL REUS W/ TWL XL LVL3 (GOWN DISPOSABLE) ×1 IMPLANT
GOWN STRL REUS W/TWL LRG LVL3 (GOWN DISPOSABLE) ×3
GOWN STRL REUS W/TWL XL LVL3 (GOWN DISPOSABLE) ×3
KIT BASIN OR (CUSTOM PROCEDURE TRAY) ×3 IMPLANT
KIT ROOM TURNOVER OR (KITS) ×3 IMPLANT
NS IRRIG 1000ML POUR BTL (IV SOLUTION) ×3 IMPLANT
PACK GENERAL/GYN (CUSTOM PROCEDURE TRAY) ×3 IMPLANT
PAD ABD 8X10 STRL (GAUZE/BANDAGES/DRESSINGS) ×2 IMPLANT
PAD ARMBOARD 7.5X6 YLW CONV (MISCELLANEOUS) ×3 IMPLANT
SWAB COLLECTION DEVICE MRSA (MISCELLANEOUS) ×2 IMPLANT
SWAB CULTURE ESWAB REG 1ML (MISCELLANEOUS) ×2 IMPLANT
TAPE CLOTH SURG 4X10 WHT LF (GAUZE/BANDAGES/DRESSINGS) ×2 IMPLANT
TOWEL OR 17X24 6PK STRL BLUE (TOWEL DISPOSABLE) ×3 IMPLANT
TOWEL OR 17X26 10 PK STRL BLUE (TOWEL DISPOSABLE) ×3 IMPLANT

## 2017-12-26 NOTE — ED Notes (Signed)
No report received on patient from previous RN advised by charge pt would be going to OR.  Previous RN advised in passing that report has been called to short stay 36 and pt was to go up and OR is aware no consent has been signed. This RN was told by previous RN that security is to lock up belongings and Celine Ahrunt will be coming to pick up.  Closed valuables in envelope in front of patient and brought all belongings to security.  Security at first refused everything but valuables envelope, advised security that pt is not coming back to the ED, and OR requested all belongings to be locked up until family arrives to pick up.  This RN refused to take clothing back to POD [facility] holding since patient will not be coming back there and requested pts belongings stay together to better ensure nothing gets lost.

## 2017-12-26 NOTE — Transfer of Care (Signed)
Immediate Anesthesia Transfer of Care Note  Patient: Jill Peters  Procedure(s) Performed: IRRIGATION AND DEBRIDEMENT BREAST ABSCESS (Right Breast)  Patient Location: PACU  Anesthesia Type:General  Level of Consciousness: awake, alert  and oriented  Airway & Oxygen Therapy: Patient Spontanous Breathing  Post-op Assessment: Report given to RN and Post -op Vital signs reviewed and stable  Post vital signs: Reviewed and stable  Last Vitals:  Vitals:   12/26/17 1615 12/26/17 1645  BP: 102/72 113/72  Pulse: 69 65  Resp:    Temp:    SpO2: 100% 98%    Last Pain:  Vitals:   12/26/17 1140  TempSrc:   PainSc: 8          Complications: No apparent anesthesia complications

## 2017-12-26 NOTE — ED Provider Notes (Signed)
With painful right breast for the past 4 days.  Has been treated with intravenous antibiotics as well as oral clindamycin.  Also taking ibuprofen for pain.  Other associated symptoms include nausea vomiting diarrhea she is uncertain if she had a fever she is been treating herself with ibuprofen.   Doug SouJacubowitz, Ewan Grau, MD 12/26/17 [phone]

## 2017-12-26 NOTE — Anesthesia Procedure Notes (Signed)
Procedure Name: LMA Insertion Date/Time: 12/26/2017 6:35 PM Performed by: Elliot DallyHuggins, Awais Cobarrubias, CRNA Pre-anesthesia Checklist: Patient identified, Emergency Drugs available, Suction available and Patient being monitored Patient Re-evaluated:Patient Re-evaluated prior to induction Oxygen Delivery Method: Circle System Utilized Preoxygenation: Pre-oxygenation with 100% oxygen Induction Type: IV induction Ventilation: Mask ventilation without difficulty LMA: LMA inserted LMA Size: 4.0 Number of attempts: 1 Airway Equipment and Method: Bite block Placement Confirmation: positive ETCO2 Tube secured with: Tape Dental Injury: Teeth and Oropharynx as per pre-operative assessment

## 2017-12-26 NOTE — Op Note (Signed)
12/26/2017  6:46 PM  PATIENT:  Jill Peters  37 y.o. female  PRE-OPERATIVE DIAGNOSIS: Right breast ABSCESS  POST-OPERATIVE DIAGNOSIS: Right breast ABSCESS  PROCEDURE:  Procedure(s): IRRIGATION AND DEBRIDEMENT BREAST ABSCESS (Right)  SURGEON:  Surgeon(s) and Role:    Axel Filler* Steele Ledonne, MD - Primary  ANESTHESIA:   general  EBL:  minimal   BLOOD ADMINISTERED:none  DRAINS: 1/4 inch iodoform packing within the right breast abscess  LOCAL MEDICATIONS USED:  NONE  SPECIMEN:  Source of Specimen: Right breast abscess  DISPOSITION OF SPECIMEN: Microbiology  COUNTS:  YES  TOURNIQUET:  * No tourniquets in log *  DICTATION: .Dragon Dictation Details of procedure: As the patient was consented she was taken back to the operating room and placed in the supine position with bilateral SCDs in place.  Patient was prepped and draped in standard fashion.  After appropriate antibiotics confirm a timeout was called all facts were verified.  This time a curvilinear incision was made from approximately 1:00 to approximately 3:00 of the right breast.  This was just at the edge of the areola.  Large amount of purulence was expressed.  Aerobic and anaerobic cultures were then obtained.  At this time blunt dissection was used to break up any loculations the area was irrigated out with sterile saline.  There is checked for hemostasis which was excellent.  The abscess cavity was then packed with quarter-inch iodoform gauze.  The wound was then dressed with 4 x 4's and a breast binder placed.  The patient tolerated the procedure well was taken to the recovery in stable condition.   PLAN OF CARE: Admit to inpatient   PATIENT DISPOSITION:  PACU - hemodynamically stable.   Delay start of Pharmacological VTE agent (>24hrs) due to surgical blood loss or risk of bleeding: yes

## 2017-12-26 NOTE — H&P (Addendum)
[facility] Admission Note  Jill Peters [birth year]  [id number].    Requesting MD: Orlie Dakin Chief Complaint/Reason for Consult: right breast abscess  HPI:  Jill Peters is a 37yo female who presented to the ED earlier today with a right breast abscess. States that it appeared about 1 week ago and has gotten larger. No active drainage. Unsure if she has had any fevers. Patient was seen in our ED 2/18 and 2 subsequent EDs, and was treated with IV antibiotics 2 days ago and discharged on oral clindamycin 2/18. She does report nausea, vomiting, and diarrhea since starting antibiotic. Returned to ED due to worsening pain. In the ED she was found to have a mild leukocytosis 11.3, lactic acid 1.31. Per EDP bedside u/s showed large abscess. Last meal was at 10AM.  PMH significant for Bipolar  Anticoagulants: none Smokes 1.5 PPD Employment: currently unemployed  ROS: Review of Systems  Constitutional: Negative.   HENT: Negative.   Eyes: Negative.   Respiratory: Negative.   Gastrointestinal: Positive for diarrhea, nausea and vomiting.  Genitourinary: Negative.   Musculoskeletal: Negative.   Skin:       Right breast pain/abscess  Neurological: Negative.    All systems reviewed and otherwise negative except for as above  Family History  Problem Relation Age of Onset  . Arthritis Mother   . Leukemia Mother   . Hypertension Father   . Alcohol abuse Father   . Bipolar disorder Father   . Arthritis Maternal Grandmother   . Hypertension Maternal Grandmother   . Alcohol abuse Cousin   . Alcohol abuse Brother        1/2 brother  . Alcohol abuse Brother        1/2 brother    Past Medical History:  Diagnosis Date  . Anxiety   . Bipolar 1 disorder ([place])   . Bipolar I disorder, most recent episode depressed ([place])   . Hidradenitis suppurativa   . Hypertension   . Hyponatremia 01/20/2013  . Lumbar back pain 08/12/2011  . Major depression 07/29/2013  . MDD  (major depressive disorder) 10/23/2017  . Obesity   . SIRS (systemic inflammatory response syndrome) ([place]) 01/20/2013    Past Surgical History:  Procedure Laterality Date  . CHOLECYSTECTOMY    . CYSTECTOMY  2005    removed from tail bone  . WISDOM TOOTH EXTRACTION  age 49    Social History:  reports that she has been smoking cigarettes.  She has a 5.00 pack-year smoking history. she has never used smokeless tobacco. She reports that she drinks alcohol. She reports that she uses drugs. Drugs: Hydrocodone and Marijuana.  Allergies:  Allergies  Allergen Reactions  . Penicillins Anaphylaxis    Has patient had a PCN reaction causing immediate rash, facial/tongue/throat swelling, SOB or lightheadedness with hypotension: Yes Has patient had a PCN reaction causing severe rash involving mucus membranes or skin necrosis: Yes Has patient had a PCN reaction that required hospitalization Yes Has patient had a PCN reaction occurring within the last 10 years: Yes If all of the above answers are "NO", then may proceed with Cephalosporin use.   Marland Kitchen Amoxicillin   . Naproxen Nausea Only     (Not in a hospital admission)  Prior to Admission medications   Medication Sig Start Date End Date Taking? Authorizing Provider  ibuprofen (ADVIL,MOTRIN) 200 MG tablet Take 800 mg by mouth every 6 (six) hours as needed.    [provider]  nicotine (NICODERM CQ -  DOSED IN MG/24 HOURS) 21 mg/24hr patch Place 1 patch (21 mg total) onto the skin daily. (May buy from over the counter): For smoking cessation 10/28/17   Lindell Spar I, NP  OLANZapine (ZYPREXA) 10 MG tablet Take 1 tablet (10 mg total) by mouth at bedtime. 12/08/17 03/08/18  Dara Hoyer, PA-C  omeprazole (PRILOSEC OTC) 20 MG tablet Take 1 tablet (20 mg total) by mouth daily. 12/08/17 12/08/18  Dara Hoyer, PA-C  promethazine (PHENERGAN) 25 MG tablet Take 1 tablet (25 mg total) by mouth every 6 (six) hours as needed for nausea or vomiting.  12/22/17   Idol, Almyra Free, PA-C  sertraline (ZOLOFT) 100 MG tablet Take 1 tablet (100 mg total) by mouth daily. 12/08/17 12/08/18  Dara Hoyer, PA-C  topiramate (TOPAMAX) 25 MG tablet Take 1 tablet (25 mg total) by mouth 2 (two) times daily. For anxiety/cravings 12/08/17 03/08/18  Dara Hoyer, PA-C  traZODone (DESYREL) 50 MG tablet Take 1 tablet (50 mg total) by mouth at bedtime and may repeat dose one time if needed. 12/08/17 01/07/18  Dara Hoyer, PA-C  hydrochlorothiazide 25 MG tablet Take 25 mg by mouth daily.    01/23/12  [provider]    Blood pressure 115/85, pulse (!) 110, temperature 98.4 F (36.9 C), temperature source Oral, resp. rate 16, weight 155 lb (70.3 kg), last menstrual period 12/01/2017, SpO2 100 %. Physical Exam: General: pleasant, WD/WN white female who is laying in bed in NAD HEENT: head is normocephalic, atraumatic.  Sclera are noninjected.  Pupils equal and round.  Ears and nose without any masses or lesions.  Mouth is pink and moist. Dentition poor Heart: regular, rate, and rhythm.  No obvious murmurs, gallops, or rubs noted.  Palpable pedal pulses bilaterally Lungs: CTAB, no wheezes, rhonchi, or rales noted.  Respiratory effort nonlabored Abd: soft, NT/ND, +BS, no masses, hernias, or organomegaly MS: all 4 extremities are symmetrical with no cyanosis, clubbing, or edema. Skin: warm and dry with no masses, lesions, or rashes Psych: A&Ox3 with an appropriate affect. Neuro: cranial nerves grossly intact, extremity CSM intact bilaterally, normal speech Right breast: erythema and tenderness around areola with an area of fluctuance at about the 3 o'clock position  Results for orders placed or performed during the hospital encounter of 12/26/17 (from the past 48 hour(s))  Comprehensive metabolic panel     Status: Abnormal   Collection Time: 12/26/17 11:42 AM  Result Value Ref Range   Sodium 140 135 - 145 mmol/L   Potassium 3.5 3.5 - 5.1 mmol/L   Chloride 106  101 - 111 mmol/L   CO2 21 (L) 22 - 32 mmol/L   Glucose, Bld 101 (H) 65 - 99 mg/dL   BUN 8 6 - 20 mg/dL   Creatinine, Ser 0.75 0.44 - 1.00 mg/dL   Calcium 9.0 8.9 - 10.3 mg/dL   Total Protein 7.9 6.5 - 8.1 g/dL   Albumin 3.8 3.5 - 5.0 g/dL   AST 19 15 - 41 U/L   ALT 13 (L) 14 - 54 U/L   Alkaline Phosphatase 68 38 - 126 U/L   Total Bilirubin 0.4 0.3 - 1.2 mg/dL   GFR calc non Af Amer >60 >60 mL/min   GFR calc Af Amer >60 >60 mL/min    Comment: (NOTE) The eGFR has been calculated using the CKD EPI equation. This calculation has not been validated in all clinical situations. eGFR's persistently <60 mL/min signify possible Chronic Kidney Disease.    Anion  gap 13 5 - 15    Comment: Performed at [facility], [place] [address] [place], [place] [address]  CBC with Differential     Status: Abnormal   Collection Time: 12/26/17 11:42 AM  Result Value Ref Range   WBC 11.3 (H) 4.0 - 10.5 K/uL   RBC 4.68 3.87 - 5.11 MIL/uL   Hemoglobin 13.5 12.0 - 15.0 g/dL   HCT 40.9 36.0 - 46.0 %   MCV 87.4 78.0 - 100.0 fL   MCH 28.8 26.0 - 34.0 pg   MCHC 33.0 30.0 - 36.0 g/dL   RDW 14.8 11.5 - 15.5 %   Platelets 226 150 - 400 K/uL   Neutrophils Relative % 71 %   Neutro Abs 8.0 (H) 1.7 - 7.7 K/uL   Lymphocytes Relative 24 %   Lymphs Abs 2.8 0.7 - 4.0 K/uL   Monocytes Relative 4 %   Monocytes Absolute 0.4 0.1 - 1.0 K/uL   Eosinophils Relative 1 %   Eosinophils Absolute 0.1 0.0 - 0.7 K/uL   Basophils Relative 0 %   Basophils Absolute 0.0 0.0 - 0.1 K/uL    Comment: Performed at [facility] [address] [place], [place] [address]  Urinalysis, Routine w reflex microscopic     Status: None   Collection Time: 12/26/17 11:42 AM  Result Value Ref Range   Color, Urine YELLOW YELLOW   APPearance CLEAR CLEAR   Specific Gravity, Urine 1.013 1.005 - 1.030   pH 7.0 5.0 - 8.0   Glucose, UA NEGATIVE NEGATIVE mg/dL   Hgb urine dipstick NEGATIVE NEGATIVE   Bilirubin Urine NEGATIVE NEGATIVE    Ketones, ur NEGATIVE NEGATIVE mg/dL   Protein, ur NEGATIVE NEGATIVE mg/dL   Nitrite NEGATIVE NEGATIVE   Leukocytes, UA NEGATIVE NEGATIVE    Comment: Performed at [facility] [address], [place], [place] [address]  I-Stat beta hCG blood, ED     Status: None   Collection Time: 12/26/17 11:53 AM  Result Value Ref Range   I-stat hCG, quantitative <5.0 <5 mIU/mL   Comment 3            Comment:   GEST. AGE      CONC.  (mIU/mL)   <=1 WEEK        5 - 50     2 WEEKS       50 - 500     3 WEEKS       100 - 10,000     4 WEEKS     1,000 - 30,000        FEMALE AND NON-PREGNANT FEMALE:     LESS THAN 5 mIU/mL   I-Stat CG4 Lactic Acid, ED     Status: None   Collection Time: 12/26/17 11:55 AM  Result Value Ref Range   Lactic Acid, Venous 1.31 0.5 - 1.9 mmol/L   No results found.    Assessment/Plan Bipolar disorder Tobacco abuse  Right breast abscess - Has been present x1 week without improvement on antibiotics alone. Abscess will need to be opened. Unable to send to Breast imaging center at this time on a Friday. Will discuss with on call MD, likely plan for I&D tonight.   Wellington Hampshire, [facility] 12/26/2017, 4:05 PM Pager: [phone] Consults: [phone] Mon-Fri 7:00 am-4:30 pm Sat-Sun 7:00 am-11:30 am

## 2017-12-26 NOTE — Anesthesia Preprocedure Evaluation (Signed)
Anesthesia Evaluation  Patient identified by MRN, date of birth, ID band Patient awake    Reviewed: Allergy & Precautions, H&P , NPO status , Patient's Chart, lab work & pertinent test results  Airway Mallampati: II   Neck ROM: full    Dental   Pulmonary Current Smoker,    breath sounds clear to auscultation       Cardiovascular hypertension,  Rhythm:regular Rate:Normal     Neuro/Psych PSYCHIATRIC DISORDERS Anxiety Depression Bipolar Disorder    GI/Hepatic GERD  ,  Endo/Other    Renal/GU      Musculoskeletal   Abdominal   Peds  Hematology   Anesthesia Other Findings   Reproductive/Obstetrics                             Anesthesia Physical Anesthesia Plan  ASA: II  Anesthesia Plan: General   Post-op Pain Management:    Induction: Intravenous  PONV Risk Score and Plan: 2 and Ondansetron, Dexamethasone, Midazolam and Treatment may vary due to age or medical condition  Airway Management Planned: LMA  Additional Equipment:   Intra-op Plan:   Post-operative Plan:   Informed Consent: I have reviewed the patients History and Physical, chart, labs and discussed the procedure including the risks, benefits and alternatives for the proposed anesthesia with the patient or authorized representative who has indicated his/her understanding and acceptance.     Plan Discussed with: CRNA, Anesthesiologist and Surgeon  Anesthesia Plan Comments:         Anesthesia Quick Evaluation

## 2017-12-26 NOTE — ED Provider Notes (Signed)
[facility]  SURGICAL Provider Note   CSN: [id number] Arrival date & time: 12/26/17  1125     History   Chief Complaint Chief Complaint  Patient presents with  . Abscess    HPI Jill Peters is a 37 y.o. female.  HPI  37 year old female history of head or nidus separative, hypertension, behavioral disorders was evaluated on December 22, 2017 for right-sided breast abscess x 2 wks seen at 2 different facilities started on IV antibiotics 2 days ago sent out with p.o. clindamycin which the patient's been compliant with.  Patient states having persistent worsening right-sided breast pain with skin lesion.  No active drainage.  Patient denies any prior history of breast cancer.  Patient denies fevers but has had associated symptoms include nausea/diarrhea since starting the medication.  Stools are nonbloody.  Past Medical History:  Diagnosis Date  . Anxiety   . Bipolar 1 disorder ([facility])   . Bipolar I disorder, most recent episode depressed ([facility])   . Hidradenitis suppurativa   . Hypertension   . Hyponatremia 01/20/2013  . Lumbar back pain 08/12/2011  . Major depression 07/29/2013  . MDD (major depressive disorder) 10/23/2017  . Obesity   . SIRS (systemic inflammatory response syndrome) ([facility]) 01/20/2013    Patient Active Problem List   Diagnosis Date Noted  . Abscess of right breast 12/26/2017  . Breast abscess 12/26/2017  . Anxiety 12/08/2017  . History of posttraumatic stress disorder (PTSD) 12/08/2017  . Reflux gastritis 12/08/2017  . Bipolar 1 disorder, manic, mild ([facility])   . Alcohol use disorder, severe, dependence ([facility]) 10/24/2017  . Opioid dependence in remission ([facility]) 07/24/2013  . Benzodiazepine dependence ([facility]) 07/24/2013  . GERD (gastroesophageal reflux disease) 07/17/2011    Past Surgical History:  Procedure Laterality Date  . CHOLECYSTECTOMY    . CYSTECTOMY  2005    removed from tail bone  . WISDOM TOOTH EXTRACTION  age 53    OB  History    Gravida Para Term Preterm AB Living   1 0 0 0 1 0   SAB TAB Ectopic Multiple Live Births   1 0 0 0 0       Home Medications    Prior to Admission medications   Medication Sig Start Date End Date Taking? Authorizing Provider  clindamycin (CLEOCIN) 150 MG capsule Take 150 mg by mouth 3 (three) times daily. 12/22/17  Yes [provider]  ibuprofen (ADVIL,MOTRIN) 200 MG tablet Take 800 mg by mouth every 6 (six) hours as needed for moderate pain.    Yes [provider]  nicotine (NICODERM CQ - DOSED IN MG/24 HOURS) 21 mg/24hr patch Place 1 patch (21 mg total) onto the skin daily. (May buy from over the counter): For smoking cessation 10/28/17  Yes Nwoko, Nicole [place] I, NP  OLANZapine (ZYPREXA) 10 MG tablet Take 1 tablet (10 mg total) by mouth at bedtime. 12/08/17 03/08/18 Yes Court Joy, PA-C  omeprazole (PRILOSEC OTC) 20 MG tablet Take 1 tablet (20 mg total) by mouth daily. Patient taking differently: Take 20 mg by mouth daily as needed (acid reflux).  12/08/17 12/08/18 Yes Court Joy, PA-C  promethazine (PHENERGAN) 25 MG tablet Take 1 tablet (25 mg total) by mouth every 6 (six) hours as needed for nausea or vomiting. 12/22/17  Yes Idol, Raynelle Fanning, PA-C  sertraline (ZOLOFT) 100 MG tablet Take 1 tablet (100 mg total) by mouth daily. 12/08/17 12/08/18 Yes Court Joy, PA-C  topiramate (TOPAMAX) 25  MG tablet Take 1 tablet (25 mg total) by mouth 2 (two) times daily. For anxiety/cravings 12/08/17 03/08/18 Yes Court Joy, PA-C  traZODone (DESYREL) 50 MG tablet Take 1 tablet (50 mg total) by mouth at bedtime and may repeat dose one time if needed. Patient taking differently: Take 50 mg by mouth at bedtime.  12/08/17 01/07/18 Yes Court Joy, PA-C  hydrochlorothiazide 25 MG tablet Take 25 mg by mouth daily.    01/23/12  [provider]    Family History Family History  Problem Relation Age of Onset  . Arthritis Mother   . Leukemia Mother   . Hypertension Father     . Alcohol abuse Father   . Bipolar disorder Father   . Arthritis Maternal Grandmother   . Hypertension Maternal Grandmother   . Alcohol abuse Cousin   . Alcohol abuse Brother        1/2 brother  . Alcohol abuse Brother        1/2 brother    Social History Social History   Tobacco Use  . Smoking status: Current Every Day Smoker    Packs/day: 0.50    Years: 10.00    Pack years: 5.00    Types: Cigarettes  . Smokeless tobacco: Never Used  Substance Use Topics  . Alcohol use: Yes    Comment: every day-beer  . Drug use: Yes    Types: Hydrocodone, Marijuana    Comment: last used today     Allergies   Penicillins; Amoxicillin; and Naproxen   Review of Systems Review of Systems  Review of Systems  Constitutional: Negative for fever and chills.  HENT: Negative for ear pain, sore throat and trouble swallowing.   Eyes: Negative for pain and visual disturbance.  Respiratory: Negative for cough and shortness of breath.   Cardiovascular: Negative for chest pain and leg swelling.  Gastrointestinal: Negative for nausea, vomiting, abdominal pain and diarrhea.  Genitourinary: Negative for dysuria, urgency and frequency.  Musculoskeletal: Negative for back pain and joint swelling.  Skin: see HP Neurological: Negative for dizziness, syncope, speech difficulty, weakness and numbness.   Physical Exam Updated Vital Signs BP 114/73 (BP Location: Left Arm)   Pulse 76   Temp 97.7 F (36.5 C) (Oral)   Resp 14   Ht 5\' 4"  (1.626 m)   Wt 70.3 kg (155 lb)   LMP 12/01/2017   SpO2 100%   BMI 26.61 kg/m   Physical Exam  Physical Exam Vitals:   12/26/17 2030 12/26/17 2057  BP:  114/73  Pulse: 77 76  Resp: 13 14  Temp:  97.7 F (36.5 C)  SpO2: 100% 100%   Constitutional: Patient is in no acute distress Head: Normocephalic and atraumatic.  Eyes: Extraocular motion intact, no scleral icterus Neck: Supple without meningismus, mass, or overt JVD Respiratory: Effort normal and  breath sounds normal. No respiratory distress. CV: Heart regular rate and rhythm, no obvious murmurs.  Pulses +2 and symmetric Abdomen: Soft, non-tender, non-distended MSK: Extremities are atraumatic without deformity, ROM intact Skin: 8 cm diameter indurated area positive warmth with no active drainage.  Positive fluctuance that includes the nipple/areola.  Bedside ultrasound with 1-1/2-2 cm in depth fluid collection along with changes consistent with cellulitis. Neuro: Alert and oriented, no motor deficit noted Psychiatric: Mood and affect are normal.  ED Treatments / Results  Labs (all labs ordered are listed, but only abnormal results are displayed) Labs Reviewed  COMPREHENSIVE METABOLIC PANEL - Abnormal; Notable for the following components:  Result Value   CO2 21 (*)    Glucose, Bld 101 (*)    ALT 13 (*)    All other components within normal limits  CBC WITH DIFFERENTIAL/PLATELET - Abnormal; Notable for the following components:   WBC 11.3 (*)    Neutro Abs 8.0 (*)    All other components within normal limits  AEROBIC/ANAEROBIC CULTURE (SURGICAL/DEEP WOUND)  URINALYSIS, ROUTINE W REFLEX MICROSCOPIC  HIV ANTIBODY (ROUTINE TESTING)  CBC  I-STAT CG4 LACTIC ACID, ED  I-STAT BETA HCG BLOOD, ED ([facility], [facility], [facility] ONLY)  I-STAT CG4 LACTIC ACID, ED    EKG  EKG Interpretation None     EMERGENCY DEPARTMENT US SOFT TISSUE INTERPRETATION "Study: Limited Soft Tissue Ultrasound"  INDICATIONS: Pain Multiple views of the body part were obtained in real-time with a multi-frequency linear probe  PERFORMED BY: Myself IMAGES ARCHIVED?: Yes SIDE:Right  BODY PART:Breast INTERPRETATION:  Abcess present and Cellulitis present   8 cm circumscribed; 1.5-2 cm depth; cobblestoning    Radiology No results found.  Procedures Procedures (including critical care time)  Medications Ordered in ED Medications  vancomycin (VANCOCIN) IVPB 1000 mg/200 mL premix ( Intravenous MAR Unhold  12/26/17 2041)  lactated ringers infusion ( Intravenous Anesthesia Volume Adjustment 12/26/17 1900)  clindamycin (CLEOCIN) capsule 150 mg (not administered)  fentaNYL (SUBLIMAZE) 100 MCG/2ML injection (not administered)  oxyCODONE (Oxy IR/ROXICODONE) 5 MG immediate release tablet (not administered)  feeding supplement (ENSURE ENLIVE) (ENSURE ENLIVE) liquid 237 mL (not administered)  oxyCODONE-acetaminophen (PERCOCET/ROXICET) 5-325 MG per tablet 1-2 tablet (1 tablet Oral Given 12/26/17 2259)  HYDROmorphone (DILAUDID) injection 1 mg (not administered)  morphine 4 MG/ML injection 4 mg (4 mg Intravenous Given 12/26/17 1612)  ondansetron (ZOFRAN) injection 4 mg (4 mg Intravenous Given 12/26/17 1612)  sodium chloride 0.9 % bolus 1,000 mL (0 mLs Intravenous Stopped 12/26/17 1720)  clindamycin (CLEOCIN) IVPB 900 mg (900 mg Intravenous Given 12/26/17 1838)  vancomycin (VANCOCIN) 1,500 mg in sodium chloride 0.9 % 500 mL IVPB ( Intravenous MAR Unhold 12/26/17 2041)  oxyCODONE (Oxy IR/ROXICODONE) immediate release tablet 5 mg (5 mg Oral Given 12/26/17 1959)    Or  oxyCODONE (ROXICODONE) 5 MG/5ML solution 5 mg ( Oral See Alternative 12/26/17 1959)     Initial Impression / Assessment and Plan / ED Course  I have reviewed the triage vital signs and the nursing notes.  Pertinent labs & imaging results that were available during my care of the patient were reviewed by me and considered in my medical decision making (see chart for details).     37 year old female history of head or nidus separative, hypertension, behavioral disorders was evaluated on December 22, 2017 for right-sided breast abscess x 2 wks seen at 2 different facilities started on IV antibiotics 2 days ago sent out with p.o. clindamycin which the patient's been compliant with.  Patient states having persistent worsening right-sided breast pain with skin lesion.  No active drainage.  Patient denies any prior history of breast cancer.  Patient denies  fevers but has had associated symptoms include nausea/diarrhea since starting the medication.  Stools are nonbloody. Patient arrives slightly tachycardic but otherwise nontoxic and well-appearing.  Physical exam as annotated above history/clinical presentation consistent with abscess.  Patient's failed outpatient treatment include IV antibiotic/p.o. antibiotics now with a worsening presentation of fluid collection to right breast including the areola.  Review of labs shows slight leukocytosis 11,000, remainder of labs unremarkable with a lactic acid 1.31.  Consult placed to general surgery.  Patient placed n.p.o. taken to the OR for intervention.      Final Clinical Impressions(s) / ED Diagnoses   Final diagnoses:  Abscess of right breast    ED Discharge Orders    None       Jaynie Collins, DO 12/26/17 2329    Doug Sou, MD 12/27/17 [phone]

## 2017-12-26 NOTE — ED Triage Notes (Signed)
Pt presents to the ed with complaints of an abscess on her right breast x 2 weeks.  Pt reports being on antibiotics with vomiting and being seen to receive nausea medication to help however antibiotics are not helping. Pt has baseball size redness to her left breast.

## 2017-12-26 NOTE — Progress Notes (Signed)
Patient arrived with Vancomycin 1,500 mg initiated in OR. 450 ml remaining . Placed on pump

## 2017-12-26 NOTE — Anesthesia Postprocedure Evaluation (Signed)
Anesthesia Post Note  Patient: Jill Peters  Procedure(s) Performed: IRRIGATION AND DEBRIDEMENT BREAST ABSCESS (Right Breast)     Patient location during evaluation: PACU Anesthesia Type: General Level of consciousness: awake and alert Pain management: pain level controlled Vital Signs Assessment: post-procedure vital signs reviewed and stable Respiratory status: spontaneous breathing, nonlabored ventilation, respiratory function stable and patient connected to nasal cannula oxygen Cardiovascular status: blood pressure returned to baseline and stable Postop Assessment: no apparent nausea or vomiting Anesthetic complications: no    Last Vitals:  Vitals:   12/26/17 2030 12/26/17 2057  BP:  114/73  Pulse: 77 76  Resp: 13 14  Temp:  36.5 C  SpO2: 100% 100%    Last Pain:  Vitals:   12/26/17 2057  TempSrc: Oral  PainSc:                  Catheryn Baconyan P Johany Hansman

## 2017-12-26 NOTE — Progress Notes (Signed)
Pharmacy Antibiotic Note  Jill Peters is a 37 y.o. female admitted on 12/26/2017 with cellulitis / abscess.  Pharmacy has been consulted for vancomycin dosing.  Plan: Give vancomycin 1.5g IV x 1, then start vancomycin 1g IV Q12h Monitor clinical picture, renal function, VT prn F/U C&S, abx deescalation / LOT   Weight: 155 lb (70.3 kg)  Temp (24hrs), Avg:98.4 F (36.9 C), Min:98.4 F (36.9 C), Max:98.4 F (36.9 C)  Recent Labs  Lab 12/22/17 0840 12/26/17 1142 12/26/17 1155 12/26/17 1614  WBC 7.3 11.3*  --   --   CREATININE 0.58 0.75  --   --   LATICACIDVEN  --   --  1.31 1.06    Estimated Creatinine Clearance: 94.4 mL/min (by C-G formula based on SCr of 0.75 mg/dL).    Allergies  Allergen Reactions  . Penicillins Anaphylaxis    Has patient had a PCN reaction causing immediate rash, facial/tongue/throat swelling, SOB or lightheadedness with hypotension: Yes Has patient had a PCN reaction causing severe rash involving mucus membranes or skin necrosis: Yes Has patient had a PCN reaction that required hospitalization Yes Has patient had a PCN reaction occurring within the last 10 years: Yes If all of the above answers are "NO", then may proceed with Cephalosporin use.   Marland Kitchen. Amoxicillin   . Naproxen Nausea Only    Thank you for allowing pharmacy to be a part of this patient's care.  Armandina StammerBATCHELDER,Avalina Benko J 12/26/2017 5:03 PM

## 2017-12-27 ENCOUNTER — Encounter (HOSPITAL_COMMUNITY): Payer: Self-pay | Admitting: General Surgery

## 2017-12-27 LAB — CBC
HCT: 38.1 % (ref 36.0–46.0)
Hemoglobin: 12.1 g/dL (ref 12.0–15.0)
MCH: 27.9 pg (ref 26.0–34.0)
MCHC: 31.8 g/dL (ref 30.0–36.0)
MCV: 87.8 fL (ref 78.0–100.0)
PLATELETS: 239 10*3/uL (ref 150–400)
RBC: 4.34 MIL/uL (ref 3.87–5.11)
RDW: 14.5 % (ref 11.5–15.5)
WBC: 7.7 10*3/uL (ref 4.0–10.5)

## 2017-12-27 LAB — HIV ANTIBODY (ROUTINE TESTING W REFLEX): HIV Screen 4th Generation wRfx: NONREACTIVE

## 2017-12-27 MED ORDER — ONDANSETRON HCL 4 MG/2ML IJ SOLN
4.0000 mg | Freq: Four times a day (QID) | INTRAMUSCULAR | Status: DC | PRN
  Administered 2017-12-27 – 2017-12-28 (×3): 4 mg via INTRAVENOUS
  Filled 2017-12-27 (×3): qty 2

## 2017-12-27 NOTE — Progress Notes (Signed)
Nutrition Brief Note  Patient identified on the Malnutrition Screening Tool (MST) Report 2/2 unintentional weight loss and decreased intake r/t poor appetite.   Patient reports that her weight loss was not really unintentional per se, she was taking abx for her infection and believes this caused her to lose weight.   She currently has a pretty good appetite. Has had some nausea/pain, but this controlled with supportive meds.   Reviewed how to use menu. She has already ordered items for lunch.   Current diet order is Carb Mod. She is seen with >75% of her breakfast tray eaten.   She does not like the Ensure at all. Will d/c given good appetite.   Per review of weights, she has remote of significant weight loss, but wt has been stable vs up for the past year.   No nutrition interventions warranted at this time. If nutrition issues arise, please consult RD.   Christophe LouisNathan Byrd Rushlow RD, LDN, CNSC Clinical Nutrition Pager: [phone] 12/27/2017 10:36 AM

## 2017-12-27 NOTE — Progress Notes (Signed)
Pt complaining of her diet not being served on time and she is upset, I called the dietary and follow up made by the secretary, also nauseated informed on call Dr. Corliss Skainssuei.

## 2017-12-27 NOTE — Progress Notes (Addendum)
1 Day Post-Op   Subjective/Chief Complaint: Required oral and IV breakthrough pain meds today.     Objective: Vital signs in last 24 hours: Temp:  [97 F (36.1 C)-98.4 F (36.9 C)] 97.8 F (36.6 C) (02/23 0602) Pulse Rate:  [58-110] 67 (02/23 0602) Resp:  [13-17] 17 (02/23 0602) BP: (102-129)/(65-86) 114/70 (02/23 0602) SpO2:  [97 %-100 %] 100 % (02/23 0602) Weight:  [70.3 kg (155 lb)] 70.3 kg (155 lb) (02/22 1801) Last BM Date: 12/25/17  Intake/Output from previous day: 02/22 0701 - 02/23 0700 In: 1820 [P.O.:120; I.V.:700; IV Piggyback:1000] Out: 10 [Blood:10] Intake/Output this shift: No intake/output data recorded.  General appearance: alert, cooperative and no distress Resp: breathing normally Breasts: still with some cellulitis.  some bloody drainage, but appears to have stopped.   Extremities: extremities normal, atraumatic, no cyanosis or edema  Lab Results:  Recent Labs    12/26/17 1142 12/27/17 0617  WBC 11.3* 7.7  HGB 13.5 12.1  HCT 40.9 38.1  PLT 226 239   BMET Recent Labs    12/26/17 1142  NA 140  K 3.5  CL 106  CO2 21*  GLUCOSE 101*  BUN 8  CREATININE 0.75  CALCIUM 9.0   PT/INR No results for input(s): LABPROT, INR in the last 72 hours. ABG No results for input(s): PHART, HCO3 in the last 72 hours.  Invalid input(s): PCO2, PO2  Studies/Results: No results found.  Anti-infectives: Anti-infectives (From admission, onward)   Start     Dose/Rate Route Frequency Ordered Stop   12/27/17 0800  vancomycin (VANCOCIN) IVPB 1000 mg/200 mL premix     1,000 mg 200 mL/hr over 60 Minutes Intravenous Every 12 hours 12/26/17 1702     12/26/17 2200  clindamycin (CLEOCIN) capsule 150 mg     150 mg Oral 3 times daily 12/26/17 2056     12/26/17 1800  clindamycin (CLEOCIN) IVPB 900 mg     900 mg 100 mL/hr over 30 Minutes Intravenous  Once 12/26/17 1655 12/26/17 1853   12/26/17 1715  vancomycin (VANCOCIN) 1,500 mg in sodium chloride 0.9 % 500 mL IVPB      1,500 mg 250 mL/hr over 120 Minutes Intravenous  Once 12/26/17 1701 12/26/17 1938      Assessment/Plan: s/p Procedure(s): IRRIGATION AND DEBRIDEMENT BREAST ABSCESS (Right) Clindamycin for cellulitis.  Will need to stay today for pain control issues.   LOS: 1 day    Jill Peters 12/27/2017

## 2017-12-28 MED ORDER — OXYCODONE-ACETAMINOPHEN 5-325 MG PO TABS: 1.0000 | ORAL_TABLET | ORAL | 0 refills | Status: AC | PRN

## 2017-12-28 MED ORDER — SULFAMETHOXAZOLE-TRIMETHOPRIM 800-160 MG PO TABS: 1.0000 | ORAL_TABLET | Freq: Two times a day (BID) | ORAL | 0 refills | Status: AC

## 2017-12-28 NOTE — Discharge Instructions (Signed)
Incision and Drainage, Care After Refer to this sheet in the next few weeks. These instructions provide you with information about caring for yourself after your procedure. Your health care provider may also give you more specific instructions. Your treatment has been planned according to current medical practices, but problems sometimes occur. Call your health care provider if you have any problems or questions after your procedure. What can I expect after the procedure? After the procedure, it is common to have:  Pain or discomfort around your incision site.  Drainage from your incision.  Follow these instructions at home:  Take over-the-counter and prescription medicines only as told by your health care provider.  If you were prescribed an antibiotic medicine, take it as told by your health care provider.Do not stop taking the antibiotic even if you start to feel better.  Followinstructions from your health care provider about: ? How to take care of your incision. ? When and how you should change your packing and bandage (dressing). Wash your hands with soap and water before you change your dressing. If soap and water are not available, use hand sanitizer. ? When you should remove your dressing.  Do not take baths, swim, or use a hot tub until your health care provider approves.  Keep all follow-up visits as told by your health care provider. This is important.  Check your incision area every day for signs of infection. Check for: ? More redness, swelling, or pain. ? More fluid or blood. ? Warmth. ? Pus or a bad smell. Contact a health care provider if:  Your cyst or abscess returns.  You have a fever.  You have more redness, swelling, or pain around your incision.  You have more fluid or blood coming from your incision.  Your incision feels warm to the touch.  You have pus or a bad smell coming from your incision. Get help right away if:  You have severe pain or  bleeding.  You cannot eat or drink without vomiting.  You have decreased urine output.  You become short of breath.  You have chest pain.  You cough up blood.  The area where the incision and drainage occurred becomes numb or it tingles. This information is not intended to replace advice given to you by your health care provider. Make sure you discuss any questions you have with your health care provider. Document Released: 01/13/2012 Document Revised: 03/22/2016 Document Reviewed: 08/11/2015 Elsevier Interactive Patient Education  2018 Elsevier Inc.  

## 2017-12-28 NOTE — Progress Notes (Signed)
Right breast dressing changed, taught and demonstrated to the pt how to do dressing changes with packing. Supplies given. Verbalized understanding. Discharge instructions given. Discharged to home accompanied by spouse.

## 2017-12-28 NOTE — Discharge Summary (Signed)
Physician Discharge Summary  Patient ID: DELROSE ROHWER MRN: [id number] DOB/AGE: 06-Jun-1981 37 y.o.  Admit date: 12/26/2017 Discharge date: 12/28/2017  Admission Diagnoses: Right breast abscess  Discharge Diagnoses:  Active Problems:   Abscess of right breast   Breast abscess   Discharged Condition: good  Hospital Course: Patient is a 37 year old female with approximate 1 week history of a right breast abscess.  Patient was on antibiotics.  Patient had continued pain as well as increase in edema.  Patient was seen in the ER in general surgery was consulted for further evaluation and management.  Patient underwent urgent I&D of right breast abscess.  Please see operative note for details.  Postoperatively patient was sent to the floor.  She was started on p.o. pain medication.  To continue with antibiotics on the floor.  Patient had wound care change education.  Microbiology cultures were still in progress.  Patient otherwise had good pain control, was afebrile, had normal white count, was deemed stable for discharge and discharged home.  Consults: None  Significant Diagnostic Studies: microbiology: wound culture: Pending  Treatments: antibiotics: vancomycin and Clinda  Discharge Exam: Blood pressure 98/63, pulse 67, temperature 98.7 F (37.1 C), temperature source Oral, resp. rate 18, height 5\' 4"  (1.626 m), weight 70.3 kg (155 lb), last menstrual period 12/01/2017, SpO2 97 %. General appearance: alert and cooperative Breasts: Packing in place, decreasing erythema, less tender to palpation  Disposition: 01-Home or Self Care  Discharge Instructions    Diet - low sodium heart healthy   Complete by:  As directed    Increase activity slowly   Complete by:  As directed      Allergies as of 12/28/2017      Reactions   Penicillins Anaphylaxis   Has patient had a PCN reaction causing immediate rash, facial/tongue/throat swelling, SOB or lightheadedness with hypotension: Yes Has  patient had a PCN reaction causing severe rash involving mucus membranes or skin necrosis: Yes Has patient had a PCN reaction that required hospitalization Yes Has patient had a PCN reaction occurring within the last 10 years: Yes If all of the above answers are "NO", then may proceed with Cephalosporin use.   Amoxicillin    Naproxen Nausea Only      Medication List    TAKE these medications   clindamycin 150 MG capsule Commonly known as:  CLEOCIN Take 150 mg by mouth 3 (three) times daily.   ibuprofen 200 MG tablet Commonly known as:  ADVIL,MOTRIN Take 800 mg by mouth every 6 (six) hours as needed for moderate pain.   nicotine 21 mg/24hr patch Commonly known as:  NICODERM CQ - dosed in mg/24 hours Place 1 patch (21 mg total) onto the skin daily. (May buy from over the counter): For smoking cessation   OLANZapine 10 MG tablet Commonly known as:  ZYPREXA Take 1 tablet (10 mg total) by mouth at bedtime.   omeprazole 20 MG tablet Commonly known as:  PRILOSEC OTC Take 1 tablet (20 mg total) by mouth daily. What changed:    when to take this  reasons to take this   oxyCODONE-acetaminophen 5-325 MG tablet Commonly known as:  PERCOCET/ROXICET Take 1-2 tablets by mouth every 4 (four) hours as needed for severe pain.   promethazine 25 MG tablet Commonly known as:  PHENERGAN Take 1 tablet (25 mg total) by mouth every 6 (six) hours as needed for nausea or vomiting.   sertraline 100 MG tablet Commonly known as:  ZOLOFT Take 1 tablet (  100 mg total) by mouth daily.   sulfamethoxazole-trimethoprim 800-160 MG tablet Commonly known as:  BACTRIM DS,SEPTRA DS Take 1 tablet by mouth 2 (two) times daily.   topiramate 25 MG tablet Commonly known as:  TOPAMAX Take 1 tablet (25 mg total) by mouth 2 (two) times daily. For anxiety/cravings   traZODone 50 MG tablet Commonly known as:  DESYREL Take 1 tablet (50 mg total) by mouth at bedtime and may repeat dose one time if needed. What  changed:  when to take this      Follow-up Information    [facility] Schedule an appointment as soon as possible for a visit in 2 week(s).   Specialty:  General Surgery Why:  Will need f/u in [facility] clinic Contact information: [address] [place] [id number] [phone]       [facility], Burna MortimerAllen L, MD .   Specialty:  [facility] Contact information: [address] [place] [id number] [phone]           Signed: Marigene EhlersRamirez Jr., Jed Limerickrmando 12/28/2017, 8:48 AM

## 2017-12-29 ENCOUNTER — Telehealth (HOSPITAL_COMMUNITY): Payer: Self-pay | Admitting: Psychology

## 2017-12-29 ENCOUNTER — Other Ambulatory Visit (HOSPITAL_COMMUNITY): Payer: 59

## 2017-12-29 ENCOUNTER — Other Ambulatory Visit (HOSPITAL_COMMUNITY): Payer: 59 | Admitting: Psychology

## 2017-12-29 DIAGNOSIS — F1020 Alcohol dependence, uncomplicated: Secondary | ICD-10-CM | POA: Diagnosis present

## 2017-12-29 DIAGNOSIS — F3111 Bipolar disorder, current episode manic without psychotic features, mild: Secondary | ICD-10-CM

## 2017-12-29 DIAGNOSIS — F1320 Sedative, hypnotic or anxiolytic dependence, uncomplicated: Secondary | ICD-10-CM

## 2017-12-29 DIAGNOSIS — F1210 Cannabis abuse, uncomplicated: Secondary | ICD-10-CM | POA: Diagnosis not present

## 2017-12-29 DIAGNOSIS — F419 Anxiety disorder, unspecified: Secondary | ICD-10-CM | POA: Diagnosis not present

## 2017-12-29 DIAGNOSIS — Z8659 Personal history of other mental and behavioral disorders: Secondary | ICD-10-CM | POA: Diagnosis not present

## 2017-12-29 DIAGNOSIS — Z811 Family history of alcohol abuse and dependence: Secondary | ICD-10-CM | POA: Diagnosis not present

## 2017-12-29 DIAGNOSIS — K2960 Other gastritis without bleeding: Secondary | ICD-10-CM | POA: Diagnosis not present

## 2017-12-30 ENCOUNTER — Encounter (HOSPITAL_COMMUNITY): Payer: Self-pay | Admitting: Psychology
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
## 2017-12-31 ENCOUNTER — Other Ambulatory Visit (HOSPITAL_COMMUNITY): Payer: 59

## 2017-12-31 ENCOUNTER — Other Ambulatory Visit (HOSPITAL_COMMUNITY): Payer: 59 | Admitting: Psychology

## 2017-12-31 DIAGNOSIS — F1020 Alcohol dependence, uncomplicated: Secondary | ICD-10-CM | POA: Diagnosis not present

## 2017-12-31 DIAGNOSIS — F1320 Sedative, hypnotic or anxiolytic dependence, uncomplicated: Secondary | ICD-10-CM

## 2017-12-31 DIAGNOSIS — F3111 Bipolar disorder, current episode manic without psychotic features, mild: Secondary | ICD-10-CM

## 2018-01-01 ENCOUNTER — Other Ambulatory Visit (HOSPITAL_COMMUNITY): Payer: 59

## 2018-01-01 ENCOUNTER — Ambulatory Visit: Payer: 59 | Admitting: General Surgery

## 2018-01-01 ENCOUNTER — Other Ambulatory Visit (HOSPITAL_COMMUNITY): Payer: 59 | Admitting: Psychology

## 2018-01-01 DIAGNOSIS — F3111 Bipolar disorder, current episode manic without psychotic features, mild: Secondary | ICD-10-CM

## 2018-01-01 DIAGNOSIS — F1020 Alcohol dependence, uncomplicated: Secondary | ICD-10-CM | POA: Diagnosis not present

## 2018-01-02 ENCOUNTER — Encounter (HOSPITAL_COMMUNITY): Payer: Self-pay | Admitting: Psychology

## 2018-01-02 LAB — AEROBIC/ANAEROBIC CULTURE W GRAM STAIN (SURGICAL/DEEP WOUND): Culture: NEGATIVE

## 2018-01-02 LAB — AEROBIC/ANAEROBIC CULTURE (SURGICAL/DEEP WOUND)
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
## 2018-01-05 ENCOUNTER — Encounter: Payer: Self-pay | Admitting: Psychiatry

## 2018-01-05 ENCOUNTER — Other Ambulatory Visit (HOSPITAL_COMMUNITY): Payer: 59

## 2018-01-05 ENCOUNTER — Other Ambulatory Visit (HOSPITAL_COMMUNITY): Payer: 59 | Attending: Psychiatry | Admitting: Psychology

## 2018-01-05 DIAGNOSIS — F1020 Alcohol dependence, uncomplicated: Secondary | ICD-10-CM | POA: Diagnosis present

## 2018-01-05 DIAGNOSIS — F3111 Bipolar disorder, current episode manic without psychotic features, mild: Secondary | ICD-10-CM

## 2018-01-05 DIAGNOSIS — F319 Bipolar disorder, unspecified: Secondary | ICD-10-CM | POA: Insufficient documentation

## 2018-01-06 ENCOUNTER — Encounter (HOSPITAL_COMMUNITY): Payer: Self-pay | Admitting: Psychology

## 2018-01-06 NOTE — Progress Notes (Signed)
    Daily Group Progress Note  Program: [facility]   01/06/2018 Aniceto BossJennifer L Vasil [id number]  Diagnosis:  Alcohol use disorder, severe, dependence (HCC)  Bipolar 1 disorder, manic, mild (HCC)   Sobriety Date: 2/19  Group Time: 1-2:30  Participation Level: Active  Behavioral Response: Appropriate and Sharing  Type of Therapy: Process Group  Interventions: CBT, Assertiveness Training, Supportive and Reframing  Topic:  Patients were active and engaged in process session. Counselors led pt's in discussion concerning their recovery from mind-altering drugs. Emphasis on goals from tx plan and thoughts and feelings processing. UDS results were returned to some individuals. One pt graduated successfully and some of her family members were present for final 10 mins of session.     Group Time: 2:30-4  Participation Level: Active  Behavioral Response: Appropriate and Sharing  Type of Therapy: Psycho-education Group  Interventions: CBT and Psychosocial Skills: Sleep Hygiene  Topic: Patients were active and engaged in group psychoeducation session. Enriqueta ShutterJamie Aethis taught for 1 hour on "sleep hygiene". Pts asked questions and were attentive, responsive.    Summary: Pt was active and engaged. She attended 1 AA meeting. She reported she is planning to stay w/ her husband this weekend and received significant feedback from other group members. Pt was encouraged to practice assertiveness and ask for accountability from her aunt to call and text her throughout the weekend. Pt admitted she does not want her marriage to fail though she is nervous about her husband's ability to sustain any changes in his mental health. Pt was open and listened to feedback w/o getting defensive. Pt asked specific questions of guest teacher concerning her sleep patterns.    UDS collected: No Results: positive for narcotics  AA/NA attended?: YesThursday  Sponsor?: No  Dorann LodgeWes Swan, LPCA LCASA 01/06/2018 11:37 AM

## 2018-01-07 ENCOUNTER — Encounter (HOSPITAL_COMMUNITY): Payer: Self-pay | Admitting: Psychology

## 2018-01-07 ENCOUNTER — Other Ambulatory Visit (HOSPITAL_COMMUNITY): Payer: 59 | Admitting: Psychology

## 2018-01-07 ENCOUNTER — Other Ambulatory Visit (HOSPITAL_COMMUNITY): Payer: 59

## 2018-01-07 DIAGNOSIS — F1210 Cannabis abuse, uncomplicated: Secondary | ICD-10-CM

## 2018-01-07 DIAGNOSIS — F1020 Alcohol dependence, uncomplicated: Secondary | ICD-10-CM | POA: Diagnosis not present

## 2018-01-07 DIAGNOSIS — F1320 Sedative, hypnotic or anxiolytic dependence, uncomplicated: Secondary | ICD-10-CM

## 2018-01-07 DIAGNOSIS — F3111 Bipolar disorder, current episode manic without psychotic features, mild: Secondary | ICD-10-CM

## 2018-01-07 NOTE — Progress Notes (Signed)
    Daily Group Progress Note  Program: [facility]   01/07/2018 Jill Peters [id number]  Diagnosis: F10.20 Alcohol Use Disorder, Severe  Sobriety Date: 12/23/17  Group Time: 1-2:30pm  Participation Level: Active  Behavioral Response: Sharing  Type of Therapy: Process Group  Interventions: Supportive  Topic: Process: The first half of group was spent in process. Members shared about their week since we last met. Their check-ins included any speed bumps or shining moments along with the number of recovery-based meetings they had attended. A new group member was present and he introduced himself to the group in this half of the session. The medical director met with the new member and the member graduating successfully from group tomorrow. Random drug tests were collected from three members.   Group Time: 2:30-4pm  Participation Level: Active  Behavioral Response: Sharing  Type of Therapy: Psycho-education Group  Interventions: Strength-based  Topic: Psycho-Ed: The Neurobiology of Addiction. The second half of group was spent in a psycho-ed on the neurobiology of addiction. A brief slide show was presented and then clips from the documentary, "Pleasure Unwoven". A discussion ensued with members providing feedback on the concept of a 'maladaptive coping mechanism'. The session was informative and members responded favorably to the new information and insight the presentation provided them.   Summary: The patient reported she had spent much of her time helping her aunt pack up their apartment as they prepare to move into a home they have purchased. She reported she had also had a follow up about her surgery and the wound is healing well. The patient reported she had not attended any meetings. The counselor noted she had not been to any over the weekend either. What is going on? The patient reported she was having a 'hard time'. She explained she felt like an inconvenience and was  reluctant to ask her aunt to take her to a meeting. Group members provided feedback about the aunt's assistance towards her aunt. The patient seemed to agree that her worries were unfounded. Another member wondered if she just wasn't making excuses? The patient admitted she has been 'really depressed lately'. She admitted she spends most of her day sitting and watching television. She had not been to the gym or engaged in any activities for a while. The patient was encouraged to go to the gym at the apartment complex and find some meetings she would be willing to attend. In the psycho-ed, the patient described her alcohol use as a poor coping mechanism for 'stopping my racing thoughts'. She was also able to relate to the insanity of the addiction, pointing out that if something really bad happened when she was drunk she would promise herself she would stop drinking. However, "that only lasted a couple of days", she admitted. As the session came to an end, the patient reported she would go to a meeting tomorrow morning and also made plans to go to the gym. This patient will have to make some changes or she will not remain in the program. We will continue to monitor closely in the days ahead.    UDS collected: No Results:   AA/NA attended?: No  Sponsor?: No   Brandon Melnick, LCAS 01/07/2018 4:58 PM

## 2018-01-08 ENCOUNTER — Other Ambulatory Visit (HOSPITAL_COMMUNITY): Payer: 59

## 2018-01-08 ENCOUNTER — Other Ambulatory Visit (HOSPITAL_COMMUNITY): Payer: 59 | Admitting: Psychology

## 2018-01-08 DIAGNOSIS — F3111 Bipolar disorder, current episode manic without psychotic features, mild: Secondary | ICD-10-CM

## 2018-01-08 DIAGNOSIS — F1020 Alcohol dependence, uncomplicated: Secondary | ICD-10-CM
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
## 2018-01-10 ENCOUNTER — Encounter (HOSPITAL_COMMUNITY): Payer: Self-pay | Admitting: Psychology

## 2018-01-10 NOTE — Progress Notes (Signed)
Patient ID: Jill BossJennifer L Robarts, female   DOB: 09/03/1981, 37 y.o.   MRN: [id number] 01/05/2018  UDS + for current (3/1;3/2) use of alcohol;Ativan,Gabapentin (25x level) THC with claimed last use /Sobriety date of 2/19 Also filled rx for 30 Oxycodone dispensed at discharge DESPITE KNOWN DIAGNOSIS OF OPIATE ADDICTION>no detectable use-where are these drugs?  Recommend pt seek higher level of care seperated from substances and trigger(s)

## 2018-01-12 ENCOUNTER — Other Ambulatory Visit (HOSPITAL_COMMUNITY): Payer: 59

## 2018-01-12 ENCOUNTER — Encounter (HOSPITAL_COMMUNITY): Payer: Self-pay | Admitting: Psychology

## 2018-01-12 ENCOUNTER — Other Ambulatory Visit (HOSPITAL_COMMUNITY): Payer: 59 | Admitting: Psychology

## 2018-01-12 NOTE — Progress Notes (Signed)
    Daily Group Progress Note  Program: [facility]   01/12/2018 Jill BossJennifer L Peters [id number]  Diagnosis:  Alcohol use disorder, severe, dependence (HCC)  Bipolar 1 disorder, manic, mild (HCC)   Sobriety Date: 2/19  Group Time: 1-2:30  Participation Level: Active  Behavioral Response: Appropriate and Sharing  Type of Therapy: Process Group  Interventions: CBT, Strength-based and Supportive  Topic: Patients were active and engaged in process session. Counselors led pt's in discussion concerning their recovery from mind-altering drugs. Emphasis on goals from tx plan and thoughts and feelings processing. UDS results were collected from some individuals.       Group Time: 2:30-4  Participation Level: Active  Behavioral Response: Appropriate and Sharing  Type of Therapy: Psycho-education Group  Interventions: Meditation: Chair Yoga  Topic: Patients were active and engaged in group psychoeducation session. Forde RadonLeanne Yates, LPC, led a 1 hour chair yoga routine focusing on positions for relaxation, breathing, movement, and body awareness. Pts were active and engaged.    Summary: Pt was active and engaged in session though she appeared somewhat more flat and distracted than previous sessions. She reported she did not attend any AA meetings but she did attend Church w/ her husband on Sunday. She stated her weekend was "uneventful", her husband did not drink nor tempt her to use substances. She spent time w/ her dog and built in accountability through communicating w/ her Aunt. Pt was active during chair yoga.  UDS collected: Yes Results: Positive as of 01/08/18 For ETOH and THC  AA/NA attended?: No  Sponsor?: No   Wes Carmon Sahli, LPCA LCASA 01/12/2018 9:25 AM

## 2018-01-13 ENCOUNTER — Encounter (HOSPITAL_COMMUNITY): Payer: Self-pay | Admitting: Psychology

## 2018-01-13 NOTE — Progress Notes (Signed)
  Counselor and patient met individually for fourth time.  After being challenged by the group, patient planned to attend some meetings with other group members.  Patient said that it is difficult to walk into meetings by herself, so she is excited to go with other group members. Patient reported that she had a good weekend and remained sober with her husband.  Patient said she continues to feel like a burden to her aunt, because her aunt and uncle are taking care of her so much.  Counselor reflected that patient is not used to being taken care of and that patient's aunt communicated that she wants to take care of her niece. Counselor and patient reviewed patient's treatment goals of remaining clean and sober and building a healthy support system.  Patient said she is going to try to go to more AA meetings and get peoples' phone numbers, to develop more of a support network.  Patient's third goal of stabilizing her Bipolar Disorder is improving and ongoing.  Patient reported that her anxiety has decreased but her depression symptoms seem to have become worse.  Counselor led discussion in challenging negative thoughts and implementing exercise, getting outside and other strategies for combating depression.  Patient fourth goal of improving relationship and addressing codependency is ongoing.  Counselor recommended the book "Codependent No More".  Counselor provided patient with a packet of questions to start working through Step 1 of the 12 Steps; patient expressed enthusiasm and said she will work on it and bring it back to discuss the following week.  Patient responded well to individual session.     [facility] Brandon Melnick, LCAS

## 2018-01-14 ENCOUNTER — Other Ambulatory Visit (HOSPITAL_COMMUNITY): Payer: 59

## 2018-01-14 ENCOUNTER — Telehealth (HOSPITAL_COMMUNITY): Payer: Self-pay | Admitting: Psychology

## 2018-01-15 ENCOUNTER — Encounter: Payer: Self-pay | Admitting: Psychiatry

## 2018-01-15 ENCOUNTER — Other Ambulatory Visit (HOSPITAL_COMMUNITY): Payer: 59 | Admitting: Psychology

## 2018-01-15 ENCOUNTER — Other Ambulatory Visit (HOSPITAL_COMMUNITY): Payer: 59

## 2018-01-15 ENCOUNTER — Telehealth (HOSPITAL_COMMUNITY): Payer: Self-pay | Admitting: Psychology

## 2018-01-15 DIAGNOSIS — F1020 Alcohol dependence, uncomplicated: Secondary | ICD-10-CM

## 2018-01-15 DIAGNOSIS — F3111 Bipolar disorder, current episode manic without psychotic features, mild: Secondary | ICD-10-CM

## 2018-01-15 DIAGNOSIS — F1320 Sedative, hypnotic or anxiolytic dependence, uncomplicated: Secondary | ICD-10-CM

## 2018-01-16 ENCOUNTER — Encounter (HOSPITAL_COMMUNITY): Payer: Self-pay | Admitting: Psychology

## 2018-01-16 NOTE — Progress Notes (Signed)
    Daily Group Progress Note  Program: [facility]   01/16/2018 Jill Peters [id number]  Diagnosis: F10.20 Alcohol Use Disorder, Severe  Sobriety Date: 01/16/18  Group Time: 1-2:30pm  Participation Level: Active  Behavioral Response: Sharing and Disruptive  Type of Therapy: Process Group  Interventions: Supportive  Topic: Process:  The first part of group was spent in process. Members shared about any struggles or challenges they had faced since we last met. One member returned to group after having missed three group sessions. The newest member, who first appeared yesterday, shared about his thwarted effort to secure drugs last night. His mother found the drugs before he did so he remained drug-free, but the event was upsetting for both of them. The conversation was lively with good feedback and disclosure among group members.   Group Time: 2:30-4pm  Participation Level: Active  Behavioral Response: Sharing, Disruptive and Monopolizing  Type of Therapy: Psycho-education Group  Interventions: Strength-based  Topic: Psycho-Education: The Relapse Cycle, Part 2. The second half of group was a psycho-ed continuing where group had left off yesterday. One member was asked to explain the four-part relapse process. She described it succinctly and was invited to reference the newest group member's story about last night and his effort to secure drugs. She was able to identify his trigger, the subsequent thought and generation of cravings. Two drug tests were collected today. Group members were engaged and shared openly throughout the session.   Summary: The patient appeared in group today for the first time in a week. She had missed the last two sessions and had reported she couldn't get rides to get to group. Today she reported she had decided to leave her husband and her aunt and she had driven to the home in [place] and moved all of her things to [employer]. She reported she had attended  meetings and had secured a sponsor. The patient was obviously very emotional in the session, but talked almost non-stop and was very disruptive throughout the session. The counselor asked her on numerous occasions to stop talking. The patient received feedback from another group member and became very defensive and tearful. Other group members reported feeling uncomfortable due to the conversation. The patient appeared to spend little time listening, but much of her time talking or preparing to talk. She admitted in process that she had taken a Ativan this morning because she was very anxious, but it was before she had spoken to the counselor and decided to return to the group. At one point, the patient got up and left the group room. She returned and was tearful. The patient was emotionally labile and vacilating between laughing and crying. A drug test was collected from the patient today. The patient will be spoken with privately to discuss how she is expected to conduct herself in group sessions. If she cannot stop interrupting and sharing every thought that comes to mind, she will be referred elsewhere.   UDS collected: Yes Results: pending  AA/NA attended?: YesWednesday  Sponsor?: Yes, patient reported she had recently secured one   Brandon Melnick, [place] 01/16/2018 8:50 AM

## 2018-01-19 ENCOUNTER — Other Ambulatory Visit (HOSPITAL_COMMUNITY): Payer: 59

## 2018-01-19 ENCOUNTER — Other Ambulatory Visit (HOSPITAL_COMMUNITY): Payer: 59 | Admitting: Psychology

## 2018-01-19 DIAGNOSIS — F3111 Bipolar disorder, current episode manic without psychotic features, mild: Secondary | ICD-10-CM

## 2018-01-19 DIAGNOSIS — F1020 Alcohol dependence, uncomplicated: Secondary | ICD-10-CM

## 2018-01-20 ENCOUNTER — Encounter (HOSPITAL_COMMUNITY): Payer: Self-pay | Admitting: Psychology
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
## 2018-01-21 ENCOUNTER — Other Ambulatory Visit (HOSPITAL_COMMUNITY): Payer: 59

## 2018-01-21 ENCOUNTER — Encounter (HOSPITAL_COMMUNITY): Payer: Self-pay | Admitting: Psychology

## 2018-01-22 ENCOUNTER — Other Ambulatory Visit (HOSPITAL_COMMUNITY): Payer: 59

## 2018-01-23 ENCOUNTER — Encounter (HOSPITAL_COMMUNITY): Payer: Self-pay | Admitting: Psychology
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
## 2018-01-26 ENCOUNTER — Other Ambulatory Visit (HOSPITAL_COMMUNITY): Payer: 59

## 2018-01-28 ENCOUNTER — Other Ambulatory Visit (HOSPITAL_COMMUNITY): Payer: 59

## 2018-01-28 ENCOUNTER — Encounter (HOSPITAL_COMMUNITY): Payer: Self-pay | Admitting: Psychology

## 2018-01-29 ENCOUNTER — Other Ambulatory Visit (HOSPITAL_COMMUNITY): Payer: 59

## 2018-02-02 ENCOUNTER — Other Ambulatory Visit (HOSPITAL_COMMUNITY): Payer: 59

## 2018-02-04 ENCOUNTER — Other Ambulatory Visit (HOSPITAL_COMMUNITY): Payer: 59

## 2018-02-04 ENCOUNTER — Other Ambulatory Visit (HOSPITAL_COMMUNITY): Payer: Self-pay | Admitting: Medical

## 2018-02-05 ENCOUNTER — Other Ambulatory Visit (HOSPITAL_COMMUNITY): Payer: 59

## 2018-02-09 ENCOUNTER — Other Ambulatory Visit (HOSPITAL_COMMUNITY): Payer: 59

## 2018-02-11 ENCOUNTER — Other Ambulatory Visit (HOSPITAL_COMMUNITY): Payer: 59

## 2018-02-12 ENCOUNTER — Other Ambulatory Visit (HOSPITAL_COMMUNITY): Payer: 59

## 2018-02-16 ENCOUNTER — Other Ambulatory Visit (HOSPITAL_COMMUNITY): Payer: 59

## 2018-02-18 ENCOUNTER — Other Ambulatory Visit (HOSPITAL_COMMUNITY): Payer: 59

## 2018-02-19 ENCOUNTER — Other Ambulatory Visit (HOSPITAL_COMMUNITY): Payer: 59

## 2018-02-23 ENCOUNTER — Other Ambulatory Visit (HOSPITAL_COMMUNITY): Payer: 59

## 2018-02-25 ENCOUNTER — Other Ambulatory Visit (HOSPITAL_COMMUNITY): Payer: 59

## 2018-02-26 ENCOUNTER — Other Ambulatory Visit (HOSPITAL_COMMUNITY): Payer: 59

## 2018-03-02 ENCOUNTER — Other Ambulatory Visit (HOSPITAL_COMMUNITY): Payer: 59

## 2018-03-04 ENCOUNTER — Other Ambulatory Visit (HOSPITAL_COMMUNITY): Payer: 59

## 2018-03-05 ENCOUNTER — Other Ambulatory Visit (HOSPITAL_COMMUNITY): Payer: 59

## 2018-03-09 ENCOUNTER — Other Ambulatory Visit (HOSPITAL_COMMUNITY): Payer: 59

## 2018-03-10 ENCOUNTER — Other Ambulatory Visit (HOSPITAL_COMMUNITY): Payer: Self-pay | Admitting: Medical

## 2019-08-28 IMAGING — CT CT HEAD W/O CM
3 series · 16 of 47 positions shown, 19 images · non-contrast
Comparison: None.

CLINICAL DATA: Headache, dizziness

EXAM:
CT HEAD WITHOUT CONTRAST
TECHNIQUE: Contiguous axial images were obtained from the base of the skull
through the vertex without intravenous contrast.

[Series 2: head wo · axial · 0.41mm/px · z∈[+1482,+1607]mm · 10 of 31 slices shown, 13 images]
[im 3/31  brain]
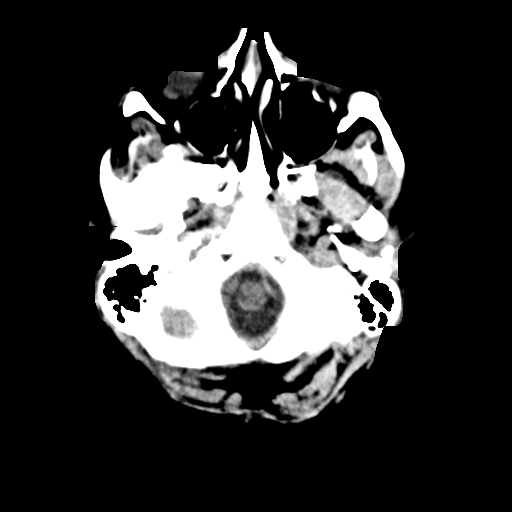
[im 3/31  bone]
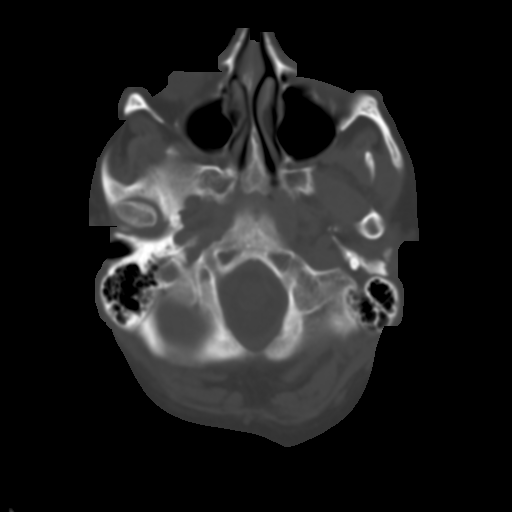
[im 6/31  brain]
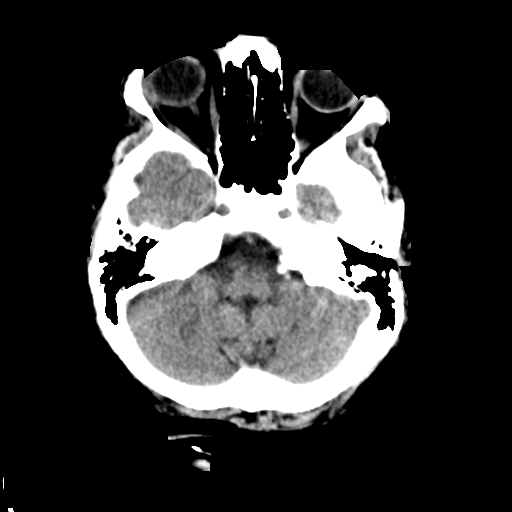
[im 9/31  brain]
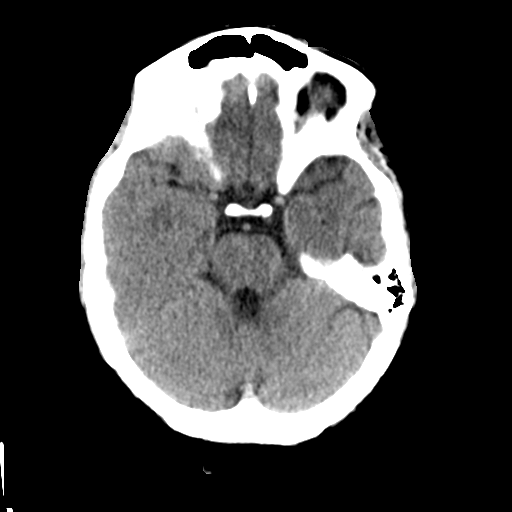
[im 11/31  brain]
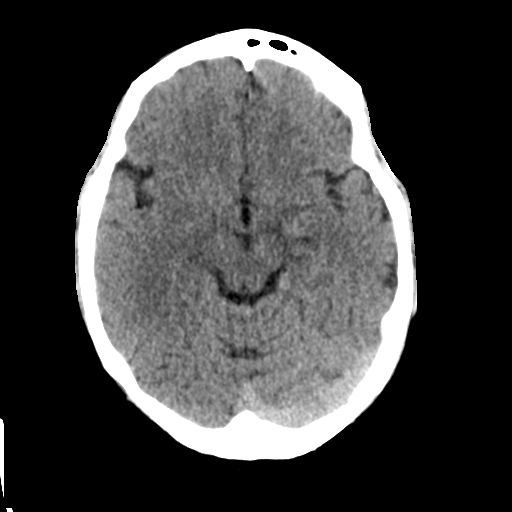
[im 14/31  brain]
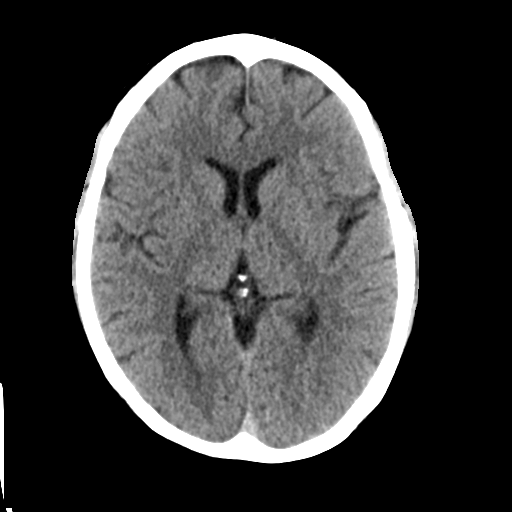
[im 14/31  bone]
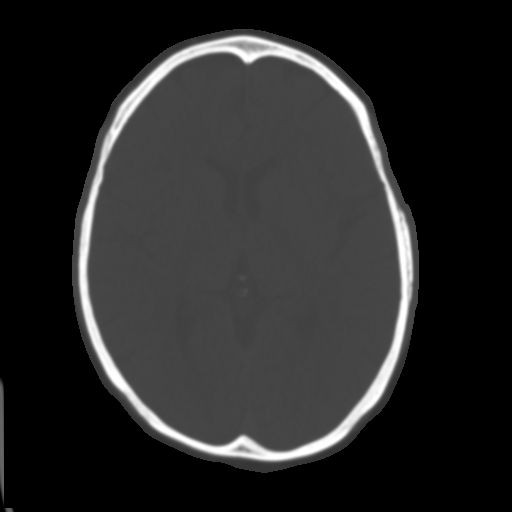
[im 17/31  brain]
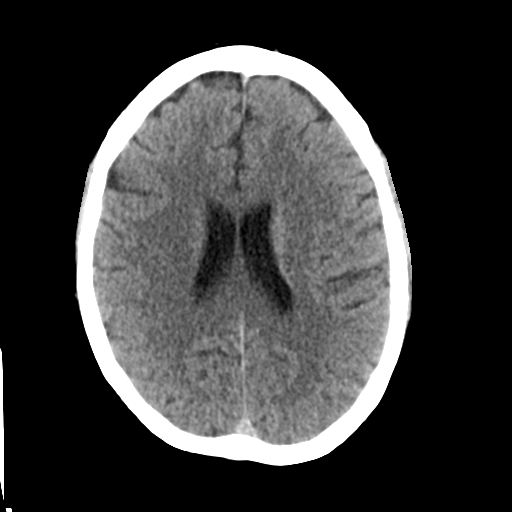
[im 20/31  brain]
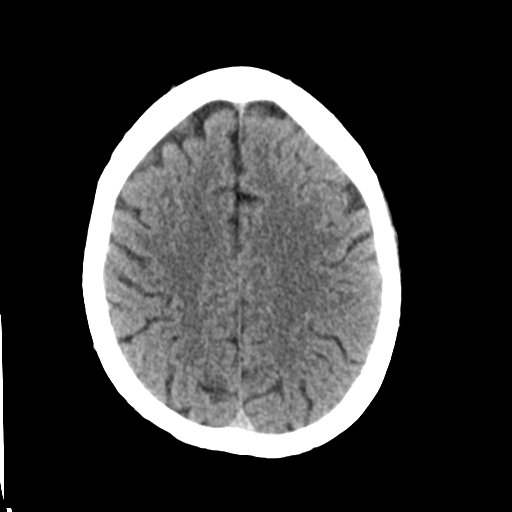
[im 23/31  brain]
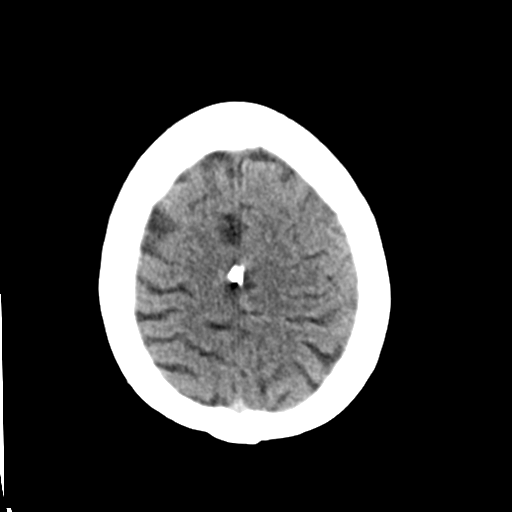
[im 25/31  brain]
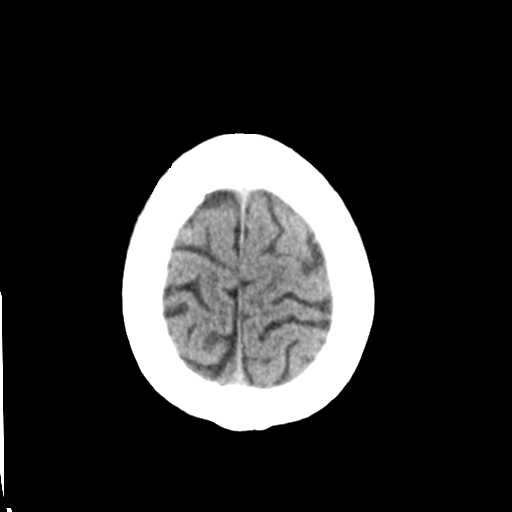
[im 25/31  bone]
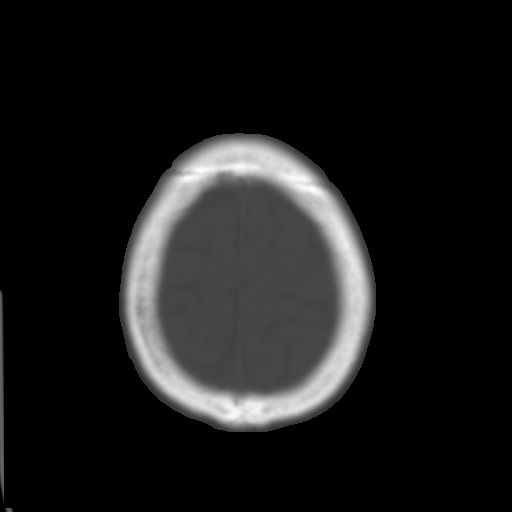
[im 28/31  brain]
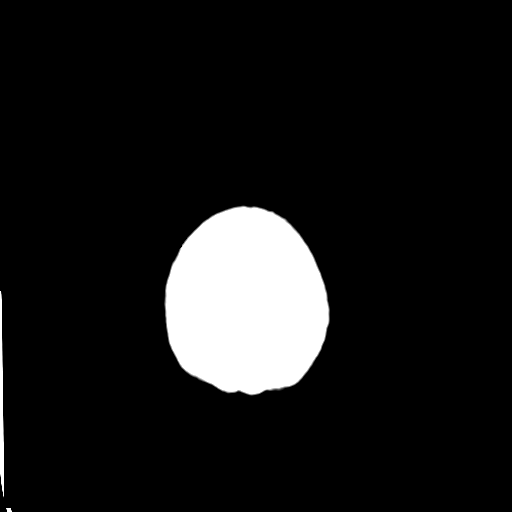

[Series 4: coronal soft tissue · coronal · 0.32mm/px · 3 of 66 slices shown]
[im 22/66  brain]
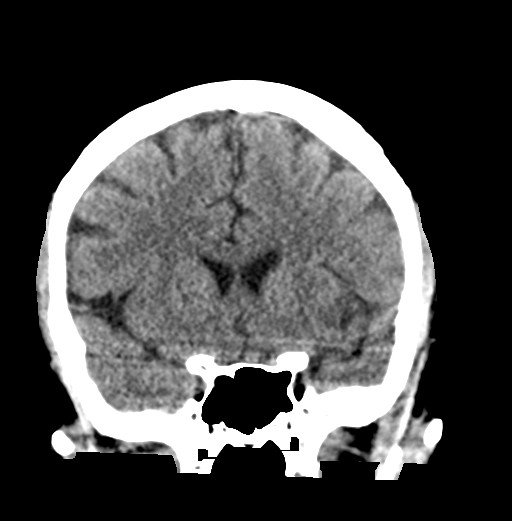
[im 29/66  brain]
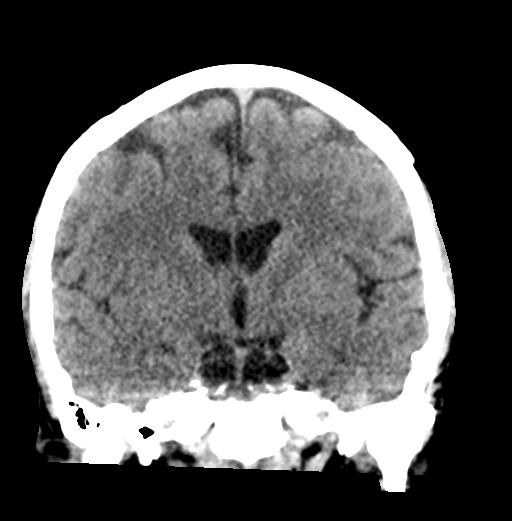
[im 37/66  brain]
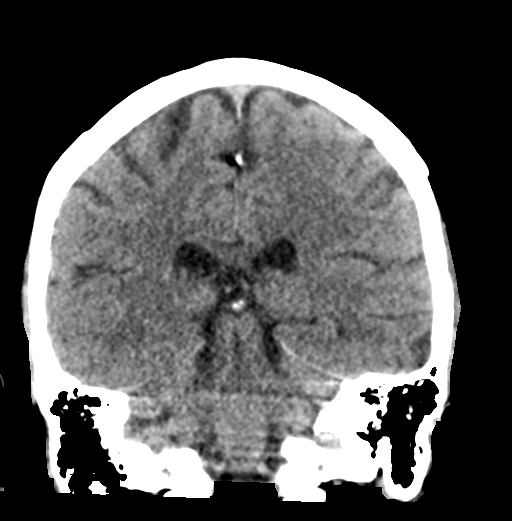

[Series 5: sagittal soft tissue · sagittal · 0.33mm/px · 3 of 52 slices shown]
[im 18/52  brain]
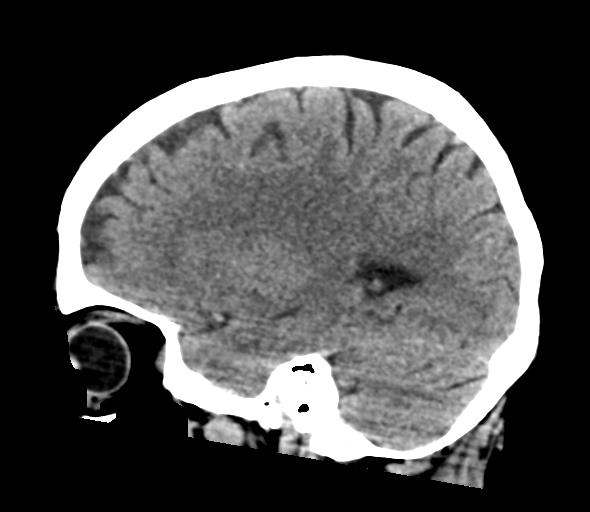
[im 26/52  brain]
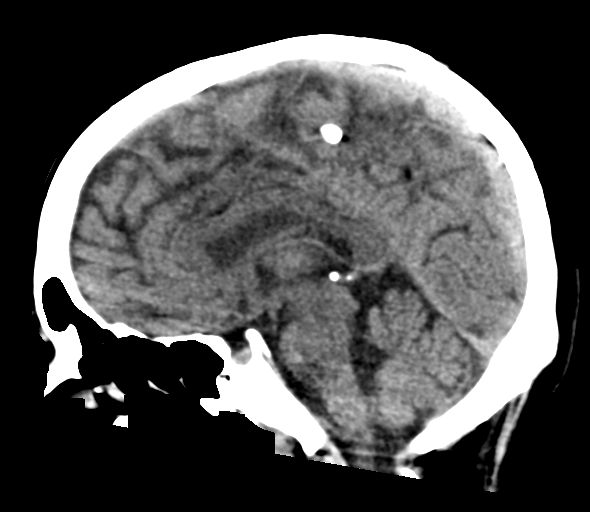
[im 35/52  brain]
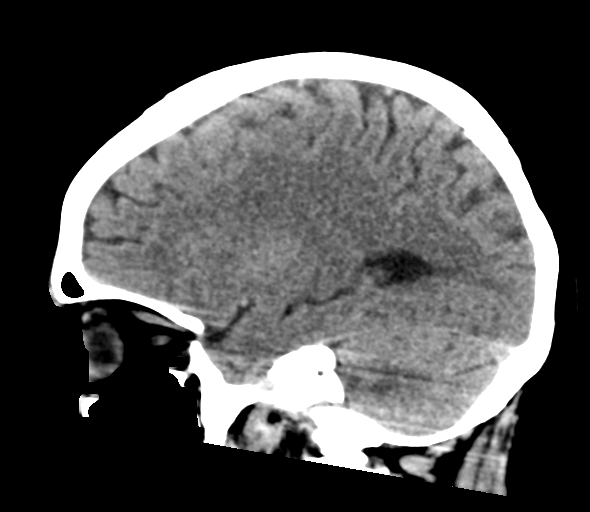

[16 of 47 positions shown; findings below may reference images not displayed]

FINDINGS: Brain: No acute intracranial abnormality. Specifically, no
hemorrhage, hydrocephalus, mass lesion, acute infarction, or
significant intracranial injury.

Vascular: No hyperdense vessel or unexpected calcification.

Skull: No acute calvarial abnormality.

Sinuses/Orbits: Visualized paranasal sinuses and mastoids clear.
Orbital soft tissues unremarkable.

Other: None
IMPRESSION: Normal study.

## 2022-10-04 DEATH — deceased

## 2023-09-11 ENCOUNTER — Other Ambulatory Visit: Payer: Self-pay | Admitting: Obstetrics and Gynecology

## 2023-09-11 DIAGNOSIS — Z1231 Encounter for screening mammogram for malignant neoplasm of breast: Secondary | ICD-10-CM
